# Patient Record
Sex: Female | Born: 1985 | Race: White | Hispanic: No | Marital: Single | Smoking: Current every day smoker
Health system: Southern US, Community
[De-identification: ages and names within clinical notes are randomized; demographics above are authoritative.]

## PROBLEM LIST (undated history)

## (undated) DIAGNOSIS — G8929 Other chronic pain: Secondary | ICD-10-CM

## (undated) DIAGNOSIS — M549 Dorsalgia, unspecified: Secondary | ICD-10-CM

## (undated) HISTORY — PX: TUBAL LIGATION: SHX77

## (undated) HISTORY — PX: NECK SURGERY: SHX720

## (undated) HISTORY — PX: TONSILLECTOMY: SUR1361

## (undated) HISTORY — DX: Dorsalgia, unspecified: M54.9

## (undated) HISTORY — DX: Other chronic pain: G89.29

---
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
## 2003-11-14 ENCOUNTER — Emergency Department (HOSPITAL_COMMUNITY): Admission: EM | Admit: 2003-11-14 | Discharge: 2003-11-14 | Payer: Self-pay | Admitting: Emergency Medicine

## 2004-02-15 ENCOUNTER — Emergency Department (HOSPITAL_COMMUNITY): Admission: EM | Admit: 2004-02-15 | Discharge: 2004-02-15 | Payer: Self-pay | Admitting: *Deleted

## 2004-12-28 ENCOUNTER — Ambulatory Visit (HOSPITAL_COMMUNITY): Admission: AD | Admit: 2004-12-28 | Discharge: 2004-12-28 | Payer: Self-pay | Admitting: Obstetrics and Gynecology

## 2005-07-10 ENCOUNTER — Ambulatory Visit (HOSPITAL_COMMUNITY): Admission: RE | Admit: 2005-07-10 | Discharge: 2005-07-10 | Payer: Self-pay | Admitting: Family Medicine

## 2005-07-30 ENCOUNTER — Encounter (HOSPITAL_COMMUNITY): Admission: RE | Admit: 2005-07-30 | Discharge: 2005-08-29 | Payer: Self-pay | Admitting: Family Medicine

## 2005-11-06 ENCOUNTER — Ambulatory Visit: Payer: Self-pay | Admitting: Internal Medicine

## 2006-01-09 ENCOUNTER — Ambulatory Visit (HOSPITAL_COMMUNITY): Admission: RE | Admit: 2006-01-09 | Discharge: 2006-01-09 | Payer: Self-pay | Admitting: Internal Medicine

## 2007-03-29 ENCOUNTER — Emergency Department (HOSPITAL_COMMUNITY): Admission: EM | Admit: 2007-03-29 | Discharge: 2007-03-29 | Payer: Self-pay | Admitting: Emergency Medicine

## 2007-04-03 ENCOUNTER — Ambulatory Visit (HOSPITAL_COMMUNITY): Admission: RE | Admit: 2007-04-03 | Discharge: 2007-04-03 | Payer: Self-pay | Admitting: Family Medicine

## 2007-11-13 ENCOUNTER — Ambulatory Visit (HOSPITAL_COMMUNITY): Admission: RE | Admit: 2007-11-13 | Discharge: 2007-11-13 | Payer: Self-pay | Admitting: Family Medicine

## 2008-05-23 ENCOUNTER — Ambulatory Visit (HOSPITAL_COMMUNITY): Admission: RE | Admit: 2008-05-23 | Discharge: 2008-05-23 | Payer: Self-pay | Admitting: Pediatrics

## 2009-04-14 ENCOUNTER — Inpatient Hospital Stay (HOSPITAL_COMMUNITY): Admission: AC | Admit: 2009-04-14 | Discharge: 2009-04-22 | Payer: Self-pay

## 2009-05-26 ENCOUNTER — Ambulatory Visit (HOSPITAL_COMMUNITY): Admission: RE | Admit: 2009-05-26 | Discharge: 2009-05-26 | Payer: Self-pay | Admitting: Psychology

## 2009-09-23 DIAGNOSIS — G8929 Other chronic pain: Secondary | ICD-10-CM

## 2009-09-23 HISTORY — DX: Other chronic pain: G89.29

## 2010-03-21 ENCOUNTER — Encounter: Payer: Self-pay | Admitting: Internal Medicine

## 2010-03-21 ENCOUNTER — Ambulatory Visit: Payer: Self-pay | Admitting: Gastroenterology

## 2010-03-21 DIAGNOSIS — K219 Gastro-esophageal reflux disease without esophagitis: Secondary | ICD-10-CM

## 2010-03-22 ENCOUNTER — Encounter: Payer: Self-pay | Admitting: Gastroenterology

## 2010-03-29 ENCOUNTER — Encounter: Payer: Self-pay | Admitting: Gastroenterology

## 2010-04-09 ENCOUNTER — Ambulatory Visit (HOSPITAL_COMMUNITY): Admission: RE | Admit: 2010-04-09 | Discharge: 2010-04-09 | Payer: Self-pay | Admitting: Internal Medicine

## 2010-04-09 ENCOUNTER — Ambulatory Visit: Payer: Self-pay | Admitting: Internal Medicine

## 2010-04-24 ENCOUNTER — Encounter: Payer: Self-pay | Admitting: Urgent Care

## 2010-04-24 ENCOUNTER — Telehealth (INDEPENDENT_AMBULATORY_CARE_PROVIDER_SITE_OTHER): Payer: Self-pay

## 2010-05-21 ENCOUNTER — Encounter (INDEPENDENT_AMBULATORY_CARE_PROVIDER_SITE_OTHER): Payer: Self-pay | Admitting: *Deleted

## 2010-07-06 ENCOUNTER — Ambulatory Visit: Payer: Self-pay | Admitting: Gastroenterology

## 2010-07-06 ENCOUNTER — Encounter: Payer: Self-pay | Admitting: Gastroenterology

## 2010-07-06 DIAGNOSIS — R1013 Epigastric pain: Secondary | ICD-10-CM

## 2010-07-10 ENCOUNTER — Ambulatory Visit (HOSPITAL_COMMUNITY): Admission: RE | Admit: 2010-07-10 | Discharge: 2010-07-10 | Payer: Self-pay | Admitting: Gastroenterology

## 2010-07-12 ENCOUNTER — Encounter: Payer: Self-pay | Admitting: Gastroenterology

## 2010-07-18 ENCOUNTER — Encounter (HOSPITAL_COMMUNITY)
Admission: RE | Admit: 2010-07-18 | Discharge: 2010-08-17 | Payer: Self-pay | Source: Home / Self Care | Admitting: Gastroenterology

## 2010-07-23 ENCOUNTER — Encounter: Payer: Self-pay | Admitting: Internal Medicine

## 2010-08-21 ENCOUNTER — Encounter: Payer: Self-pay | Admitting: Internal Medicine

## 2010-08-30 ENCOUNTER — Emergency Department (HOSPITAL_COMMUNITY)
Admission: EM | Admit: 2010-08-30 | Discharge: 2010-08-30 | Payer: Self-pay | Source: Home / Self Care | Admitting: Emergency Medicine

## 2010-10-13 ENCOUNTER — Encounter: Payer: Self-pay | Admitting: Family Medicine

## 2010-10-13 ENCOUNTER — Encounter: Payer: Self-pay | Admitting: Gastroenterology

## 2010-10-23 NOTE — Medication Information (Signed)
Summary: Tax adviser   Imported By: Rosine Beat 05/21/2010 10:01:34  _____________________________________________________________________  External Attachment:    Type:   Image     Comment:   External Document
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
## 2010-10-23 NOTE — Letter (Signed)
Summary: [facility] REFERRAL  [facility] REFERRAL   Imported By: Ave Filter 07/23/2010 12:33:55  _____________________________________________________________________  External Attachment:    Type:   Image     Comment:   External Document

## 2010-10-23 NOTE — Medication Information (Signed)
Summary: ACIPHEX  ACIPHEX   Imported By: Rexene Alberts 04/24/2010 14:24:06  _____________________________________________________________________  External Attachment:    Type:   Image     Comment:   External Document  Appended Document: ACIPHEX Please call for prior auth, pt failed prilosec, nexium, omeprazole  Appended Document: ACIPHEX paperwork on [facility] cart

## 2010-10-23 NOTE — Letter (Signed)
Summary: [facility]  referral from [facility]   Imported By: Rosine Beat 03/29/2010 15:44:14  _____________________________________________________________________  External Attachment:    Type:   Image     Comment:   External Document

## 2010-10-23 NOTE — Letter (Signed)
Summary: abd u/s order  abd u/s order   Imported By: Ave Filter 07/06/2010 12:18:43  _____________________________________________________________________  External Attachment:    Type:   Image     Comment:   External Document

## 2010-10-23 NOTE — Letter (Signed)
Summary: egd order  egd order   Imported By: Hendricks Limes LPN 93/81/0175 10:25:85  _____________________________________________________________________  External Attachment:    Type:   Image     Comment:   External Document

## 2010-10-23 NOTE — Assessment & Plan Note (Signed)
Summary: fu egd in july, 2011 for reflux,hiatal hernia/jbb   Visit Type:  f/u Primary Care Provider:  Halm  Chief Complaint:  follow up- still has heartburn.  History of Present Illness: Patient is here for f/u. She had EGD in 7/11. She had Z-line at 40cm, small hh.   Aciphex worked for about four weeks. Now she has recurrent burning in the chest all the way to her throat. Some days heartburn all day. Has problems at least every other day. No nocturnal symptoms. Has heartburn without meals. Avoids spicey and greasy foods. Decreased caffeine intake. One cup coffee and one can of [facility] daily. No dysphagia. BMs good. No melena, brbpr. Takes ibuprofen 1000mg  every 3 hours. Has some epigastric pain at times. No sure if pp. H/O biliary dyskinesia by HIDA in 2006.   Current Medications (verified): 1)  Adderall 30 Mg Tabs (Amphetamine-Dextroamphetamine) .... Once Daily 2)  Alprazolam 1 Mg Tabs (Alprazolam) .... Three Times A Day 3)  Singulair 10 Mg Tabs (Montelukast Sodium) .... Once Daily As Needed 4)  Nasal Spray .... Once Daily 5)  Ibuprofen 200 Mg Tabs (Ibuprofen) .... 6 Q Four Hours As Needed 6)  Aciphex 20 Mg Tbec (Rabeprazole Sodium) .... Once Daily  Allergies (verified): 1)  ! Demerol  Family History: Father, deceased age 24s, brain tumor. Grandmother, ?stomach or colon cancer, Crohns  Another second degree relative with pancreatic cancer Mother, stomach ulcers, h/o intestinal blockage, IBS  Review of Systems      See HPI  Vital Signs:  Patient profile:   25 year old female Height:      64 inches Weight:      138 pounds BMI:     23.77 Temp:     98.1 degrees F oral Pulse rate:   88 / minute BP sitting:   110 / 70  (left arm) Cuff size:   regular  Vitals Entered By: Hendricks Limes LPN (July 06, 2010 10:58 AM)  Physical Exam  General:  Well developed, well nourished, no acute distress. Head:  Normocephalic and atraumatic. Eyes:  sclera nonicteric Mouth:  op  moist Abdomen:  Soft. Mild epig tenderness. No rebound or guarding. No HSM or masses. No abd bruit or hernia.  Impression & Recommendations:  Problem # 1:  GERD, SEVERE (ICD-530.81)  Refractory heartburn-like symptoms and epigastric discomfort. She has been on high-dose omeprazole, Nexium. Now on Aciphex which seemed to help short-term. She continues to take way too much ibuprofen....1000mg  every 3 hours. I had long discussion with her about her ibuprofen use and risk of GI and renal complications including renal failure. She is taking ibuprofen for chronic back pain. She has not been to a specialist and I advised she follow up with her PCP for this.   She will take up to two Aciphex daily as needed but try limiting to once daily. #14 day voucher provided to supplement her RX. Given her h/o biliary dyskinesia in 2006 and refractory GERD symptoms, we will r/o atypcial biliary symptoms. Abd U/S in near future. If gb w/u negative, then next step would be pH/impedence study as suggested by Dr. Jena Gauss.   Antireflux measures to be followed, addressed again today.   Orders: Est. Patient Level II (16109)

## 2010-10-23 NOTE — Letter (Signed)
Summary: Internal Other  Internal Other   Imported By: Peggyann Shoals 03/22/2010 14:31:03  _____________________________________________________________________  External Attachment:    Type:   Image     Comment:   External Document

## 2010-10-23 NOTE — Progress Notes (Signed)
Summary: aciphex samples  Phone Note Call from Patient Call back at Home Phone [phone]   Caller: Patient Summary of Call:  pt called-left voicemail- she is out of Aciphex and needs a PA with her insurance. gave #12 samples to last untill PA is finished. called pt left voicemail for her to pick up samples Initial call taken by: Hendricks Limes LPN,  April 24, 2010 3:49 PM

## 2010-10-23 NOTE — Letter (Signed)
Summary: HIDA SCAN ORDER  HIDA SCAN ORDER   Imported By: Ave Filter 07/12/2010 08:46:46  _____________________________________________________________________  External Attachment:    Type:   Image     Comment:   External Document

## 2010-10-23 NOTE — Letter (Signed)
Summary: [facility]  [facility]   Imported By: Rexene Alberts 08/21/2010 11:01:31  _____________________________________________________________________  External Attachment:    Type:   Image     Comment:   External Document  Appended Document: [facility] add carafate suspension to regimen; one gram qid disp 60 doses w 1 yr refill for "acid sensitive" esophagus  Appended Document: [facility] pt aware, rx called to [facility]
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
## 2010-12-27 ENCOUNTER — Emergency Department (HOSPITAL_COMMUNITY)
Admission: EM | Admit: 2010-12-27 | Discharge: 2010-12-27 | Disposition: A | Discharge status: Home / Self Care | Payer: Medicaid Other | Attending: Emergency Medicine | Admitting: Emergency Medicine

## 2010-12-27 DIAGNOSIS — M545 Low back pain, unspecified: Secondary | ICD-10-CM | POA: Insufficient documentation

## 2010-12-27 DIAGNOSIS — R509 Fever, unspecified: Secondary | ICD-10-CM | POA: Insufficient documentation

## 2010-12-27 DIAGNOSIS — J020 Streptococcal pharyngitis: Secondary | ICD-10-CM | POA: Insufficient documentation

## 2010-12-27 DIAGNOSIS — R599 Enlarged lymph nodes, unspecified: Secondary | ICD-10-CM | POA: Insufficient documentation

## 2010-12-27 DIAGNOSIS — IMO0001 Reserved for inherently not codable concepts without codable children: Secondary | ICD-10-CM | POA: Insufficient documentation

## 2010-12-27 DIAGNOSIS — M542 Cervicalgia: Secondary | ICD-10-CM | POA: Insufficient documentation

## 2010-12-27 DIAGNOSIS — R070 Pain in throat: Secondary | ICD-10-CM | POA: Insufficient documentation

## 2010-12-27 DIAGNOSIS — H9209 Otalgia, unspecified ear: Secondary | ICD-10-CM | POA: Insufficient documentation

## 2010-12-27 LAB — URINALYSIS, ROUTINE W REFLEX MICROSCOPIC
Bilirubin Urine: NEGATIVE
Glucose, UA: NEGATIVE mg/dL
Hgb urine dipstick: NEGATIVE
Ketones, ur: 15 mg/dL — AB
Nitrite: NEGATIVE
Protein, ur: NEGATIVE mg/dL
Specific Gravity, Urine: 1.026 (ref 1.005–1.030)
Urobilinogen, UA: 0.2 mg/dL (ref 0.0–1.0)
pH: 5.5 (ref 5.0–8.0)

## 2010-12-27 LAB — CBC
HCT: 34.5 % — ABNORMAL LOW (ref 36.0–46.0)
Hemoglobin: 11.9 g/dL — ABNORMAL LOW (ref 12.0–15.0)
MCH: 31 pg (ref 26.0–34.0)
MCHC: 34.5 g/dL (ref 30.0–36.0)
MCV: 89.8 fL (ref 78.0–100.0)
Platelets: 242 10*3/uL (ref 150–400)
RBC: 3.84 MIL/uL — ABNORMAL LOW (ref 3.87–5.11)
RDW: 12.4 % (ref 11.5–15.5)
WBC: 15.8 10*3/uL — ABNORMAL HIGH (ref 4.0–10.5)

## 2010-12-27 LAB — DIFFERENTIAL
Basophils Absolute: 0 10*3/uL (ref 0.0–0.1)
Basophils Relative: 0 % (ref 0–1)
Eosinophils Absolute: 0 10*3/uL (ref 0.0–0.7)
Eosinophils Relative: 0 % (ref 0–5)
Lymphocytes Relative: 10 % — ABNORMAL LOW (ref 12–46)
Lymphs Abs: 1.6 K/uL (ref 0.7–4.0)
Monocytes Absolute: 1 K/uL (ref 0.1–1.0)
Monocytes Relative: 7 % (ref 3–12)
Neutro Abs: 13.1 K/uL — ABNORMAL HIGH (ref 1.7–7.7)
Neutrophils Relative %: 83 % — ABNORMAL HIGH (ref 43–77)

## 2010-12-27 LAB — POCT I-STAT, CHEM 8
BUN: 12 mg/dL (ref 6–23)
Calcium, Ion: 1.08 mmol/L — ABNORMAL LOW (ref 1.12–1.32)
Chloride: 102 mEq/L (ref 96–112)
Creatinine, Ser: 0.7 mg/dL (ref 0.4–1.2)
Glucose, Bld: 104 mg/dL — ABNORMAL HIGH (ref 70–99)
HCT: 37 % (ref 36.0–46.0)
Hemoglobin: 12.6 g/dL (ref 12.0–15.0)
Potassium: 3.3 meq/L — ABNORMAL LOW (ref 3.5–5.1)
Sodium: 137 meq/L (ref 135–145)
TCO2: 24 mmol/L (ref 0–100)

## 2010-12-27 LAB — RAPID STREP SCREEN (MED CTR MEBANE ONLY): Streptococcus, Group A Screen (Direct): POSITIVE — AB

## 2010-12-30 LAB — CBC
HCT: 20 % — ABNORMAL LOW (ref 36.0–46.0)
HCT: 20.3 % — ABNORMAL LOW (ref 36.0–46.0)
HCT: 20.6 % — ABNORMAL LOW (ref 36.0–46.0)
HCT: 21.8 % — ABNORMAL LOW (ref 36.0–46.0)
HCT: 24 % — ABNORMAL LOW (ref 36.0–46.0)
HCT: 24.7 % — ABNORMAL LOW (ref 36.0–46.0)
HCT: 32.3 % — ABNORMAL LOW (ref 36.0–46.0)
Hemoglobin: 10.9 g/dL — ABNORMAL LOW (ref 12.0–15.0)
Hemoglobin: 6.8 g/dL — CL (ref 12.0–15.0)
Hemoglobin: 6.8 g/dL — CL (ref 12.0–15.0)
Hemoglobin: 6.9 g/dL — CL (ref 12.0–15.0)
Hemoglobin: 6.9 g/dL — CL (ref 12.0–15.0)
Hemoglobin: 7 g/dL — CL (ref 12.0–15.0)
Hemoglobin: 7.2 g/dL — CL (ref 12.0–15.0)
Hemoglobin: 8.2 g/dL — ABNORMAL LOW (ref 12.0–15.0)
Hemoglobin: 8.5 g/dL — ABNORMAL LOW (ref 12.0–15.0)
MCHC: 33.7 g/dL (ref 30.0–36.0)
MCHC: 33.8 g/dL (ref 30.0–36.0)
MCHC: 34 g/dL (ref 30.0–36.0)
MCHC: 34 g/dL (ref 30.0–36.0)
MCHC: 34.1 g/dL (ref 30.0–36.0)
MCHC: 34.1 g/dL (ref 30.0–36.0)
MCHC: 34.1 g/dL (ref 30.0–36.0)
MCHC: 34.3 g/dL (ref 30.0–36.0)
MCHC: 34.5 g/dL (ref 30.0–36.0)
MCHC: 35.1 g/dL (ref 30.0–36.0)
MCV: 92.8 fL (ref 78.0–100.0)
MCV: 93.2 fL (ref 78.0–100.0)
MCV: 93.2 fL (ref 78.0–100.0)
MCV: 93.4 fL (ref 78.0–100.0)
MCV: 93.7 fL (ref 78.0–100.0)
MCV: 94.1 fL (ref 78.0–100.0)
MCV: 94.1 fL (ref 78.0–100.0)
MCV: 94.2 fL (ref 78.0–100.0)
MCV: 94.4 fL (ref 78.0–100.0)
MCV: 94.8 fL (ref 78.0–100.0)
Platelets: 150 10*3/uL (ref 150–400)
Platelets: 156 10*3/uL (ref 150–400)
Platelets: 161 10*3/uL (ref 150–400)
Platelets: 171 10*3/uL (ref 150–400)
Platelets: 189 10*3/uL (ref 150–400)
Platelets: 296 10*3/uL (ref 150–400)
RBC: 2.11 MIL/uL — ABNORMAL LOW (ref 3.87–5.11)
RBC: 2.14 MIL/uL — ABNORMAL LOW (ref 3.87–5.11)
RBC: 2.19 MIL/uL — ABNORMAL LOW (ref 3.87–5.11)
RBC: 2.21 MIL/uL — ABNORMAL LOW (ref 3.87–5.11)
RBC: 2.27 MIL/uL — ABNORMAL LOW (ref 3.87–5.11)
RBC: 2.56 MIL/uL — ABNORMAL LOW (ref 3.87–5.11)
RBC: 2.62 MIL/uL — ABNORMAL LOW (ref 3.87–5.11)
RBC: 3.09 MIL/uL — ABNORMAL LOW (ref 3.87–5.11)
RBC: 3.43 MIL/uL — ABNORMAL LOW (ref 3.87–5.11)
RDW: 12 % (ref 11.5–15.5)
RDW: 12.2 % (ref 11.5–15.5)
RDW: 12.5 % (ref 11.5–15.5)
RDW: 12.6 % (ref 11.5–15.5)
WBC: 10 10*3/uL (ref 4.0–10.5)
WBC: 16.1 10*3/uL — ABNORMAL HIGH (ref 4.0–10.5)
WBC: 5.2 10*3/uL (ref 4.0–10.5)
WBC: 5.3 10*3/uL (ref 4.0–10.5)
WBC: 6.4 10*3/uL (ref 4.0–10.5)
WBC: 6.4 10*3/uL (ref 4.0–10.5)

## 2010-12-30 LAB — POCT I-STAT, CHEM 8
Chloride: 108 meq/L (ref 96–112)
Creatinine, Ser: 0.7 mg/dL (ref 0.4–1.2)
Glucose, Bld: 146 mg/dL — ABNORMAL HIGH (ref 70–99)
HCT: 33 % — ABNORMAL LOW (ref 36.0–46.0)
Potassium: 3.2 meq/L — ABNORMAL LOW (ref 3.5–5.1)

## 2010-12-30 LAB — URINALYSIS, ROUTINE W REFLEX MICROSCOPIC
Ketones, ur: NEGATIVE mg/dL
Protein, ur: >300 mg/dL — AB
Urobilinogen, UA: 1 mg/dL (ref 0.0–1.0)

## 2010-12-30 LAB — BASIC METABOLIC PANEL
BUN: 6 mg/dL (ref 6–23)
BUN: 9 mg/dL (ref 6–23)
CO2: 28 mEq/L (ref 19–32)
Calcium: 8.2 mg/dL — ABNORMAL LOW (ref 8.4–10.5)
Chloride: 103 mEq/L (ref 96–112)
Chloride: 107 mEq/L (ref 96–112)
Creatinine, Ser: 0.58 mg/dL (ref 0.4–1.2)
GFR calc Af Amer: >60 mL/min (ref >60)
Potassium: 3.7 mEq/L (ref 3.5–5.1)

## 2010-12-30 LAB — TYPE AND SCREEN: ABO/RH(D): A POS

## 2010-12-30 LAB — PROTIME-INR
INR: 1.1 (ref 0.00–1.49)
Prothrombin Time: 15.5 s — ABNORMAL HIGH (ref 11.6–15.2)

## 2010-12-30 LAB — URINE MICROSCOPIC-ADD ON

## 2010-12-30 LAB — PREPARE RBC (CROSSMATCH)

## 2010-12-30 LAB — MRSA PCR SCREENING: MRSA by PCR: NEGATIVE
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
## 2011-02-08 NOTE — Discharge Summary (Signed)
NAMEEVANGELYNE, LOJA NO.:  [id number]   MEDICAL RECORD NO.:  [id number]          PATIENT TYPE:  OIB   LOCATION:  LDR2                          FACILITY:  [facility]   PHYSICIAN:  Tilda Burrow, M.D. DATE OF BIRTH:  11-20-1985   DATE OF ADMISSION:  12/28/2004  DATE OF DISCHARGE:  04/07/2006LH                                 DISCHARGE SUMMARY   CHIEF COMPLAINT:  Absence of fetal movement at 23 weeks' gestation.   HISTORY OF PRESENT ILLNESS:  This is a 25 year old female, G1, P0 at 12  weeks' gestation followed by Dr. Ruthy Dick in [place], [place], who  presents to labor and delivery here complaining of absence of fetal  movement.  She became concerned after discussing with a friend who felt  absence of confirmed fetal movement at 23 weeks was distinctly abnormal.  The patient does not know the location of placenta on ultrasound.  She has  had no bleeding or spotting.  She does ask about a light vaginal discharge.   PHYSICAL EXAMINATION:  GENERAL:  Healthy-appearing, red-headed female alert  and oriented x3.  ABDOMEN:  Nontender abdomen.  Gravid uterus to above the umbilicus.  Fetal  heart rate documentation shows an active fetus with fetal heart rate in the  140s with no decelerations present.  Appropriate for gestational age.  PELVIC:  Cervix is posterior, long, closed and firm.  External monitoring  shows that fetal kicks are occurring and indeed the patient has heard the  fetal movement on ultrasound.   IMPRESSION:  Normal pregnancy with fetal movement now noted by patient.   PLAN:  Routine OB care.      JVF/MEDQ  D:  12/28/2004  T:  12/29/2004  Job:  [id number]   cc:   Ruthy Dick  [address]  [place]  [place] [address]  Fax: [phone]   [facility]

## 2011-07-09 LAB — URINE CULTURE
Colony Count: NO GROWTH
Culture: NO GROWTH

## 2011-07-09 LAB — URINALYSIS, ROUTINE W REFLEX MICROSCOPIC
Glucose, UA: NEGATIVE
Hgb urine dipstick: NEGATIVE
Specific Gravity, Urine: 1.01
pH: 7

## 2011-07-09 LAB — COMPREHENSIVE METABOLIC PANEL
AST: 22
Albumin: 4.4
Alkaline Phosphatase: 48
BUN: 9
Chloride: 103
Creatinine, Ser: 0.87
GFR calc Af Amer: >60
Potassium: 3.9
Total Protein: 7.3

## 2011-07-09 LAB — CBC
HCT: 42.5
Platelets: 330
RDW: 12.7
WBC: 10.4

## 2011-07-09 LAB — URINE MICROSCOPIC-ADD ON

## 2011-07-09 LAB — DIFFERENTIAL
Eosinophils Relative: 1
Lymphocytes Relative: 36
Monocytes Absolute: 0.6
Monocytes Relative: 6
Neutro Abs: 5.8

## 2012-12-08 ENCOUNTER — Telehealth: Payer: Self-pay | Admitting: Nurse Practitioner

## 2012-12-08 DIAGNOSIS — F909 Attention-deficit hyperactivity disorder, unspecified type: Secondary | ICD-10-CM

## 2012-12-08 NOTE — Telephone Encounter (Signed)
Needs refill on adderall. [facility]

## 2012-12-09 ENCOUNTER — Telehealth: Payer: Self-pay | Admitting: Family Medicine

## 2012-12-09 ENCOUNTER — Telehealth: Payer: Self-pay | Admitting: Nurse Practitioner

## 2012-12-09 DIAGNOSIS — F909 Attention-deficit hyperactivity disorder, unspecified type: Secondary | ICD-10-CM

## 2012-12-09 MED ORDER — AMPHETAMINE-DEXTROAMPHETAMINE 20 MG PO TABS: 20.0000 mg | ORAL_TABLET | Freq: Two times a day (BID) | ORAL | Status: DC

## 2012-12-09 NOTE — Telephone Encounter (Signed)
Rx written incorrectly for only 30 and patient takes BID and needed 60

## 2012-12-09 NOTE — Telephone Encounter (Signed)
rx was written incorrectly. It only had for 30 pill. Needs to be 60 pills. adderrall  [facility] [place]

## 2012-12-09 NOTE — Telephone Encounter (Signed)
Rx was completed and left at the front desk per Paulene Floor.

## 2012-12-09 NOTE — Telephone Encounter (Signed)
Chart on desk

## 2012-12-15 ENCOUNTER — Other Ambulatory Visit: Payer: Self-pay | Admitting: *Deleted

## 2012-12-15 MED ORDER — TRAMADOL HCL 50 MG PO TABS: 50.0000 mg | ORAL_TABLET | Freq: Three times a day (TID) | ORAL | Status: DC | PRN

## 2012-12-23 ENCOUNTER — Telehealth: Payer: Self-pay | Admitting: Nurse Practitioner

## 2012-12-23 NOTE — Telephone Encounter (Signed)
Called pt and told she must be seen

## 2012-12-30 ENCOUNTER — Encounter: Payer: Self-pay | Admitting: Nurse Practitioner

## 2012-12-30 ENCOUNTER — Ambulatory Visit (INDEPENDENT_AMBULATORY_CARE_PROVIDER_SITE_OTHER): Payer: Medicaid Other | Admitting: Nurse Practitioner

## 2012-12-30 VITALS — BP 122/67 | HR 83 | Temp 98.7°F | Ht 64.0 in | Wt 149.0 lb

## 2012-12-30 DIAGNOSIS — G8929 Other chronic pain: Secondary | ICD-10-CM

## 2012-12-30 DIAGNOSIS — M549 Dorsalgia, unspecified: Secondary | ICD-10-CM

## 2012-12-30 DIAGNOSIS — Z1339 Encounter for screening examination for other mental health and behavioral disorders: Secondary | ICD-10-CM

## 2012-12-30 DIAGNOSIS — F411 Generalized anxiety disorder: Secondary | ICD-10-CM

## 2012-12-30 DIAGNOSIS — K219 Gastro-esophageal reflux disease without esophagitis: Secondary | ICD-10-CM

## 2012-12-30 DIAGNOSIS — K449 Diaphragmatic hernia without obstruction or gangrene: Secondary | ICD-10-CM | POA: Insufficient documentation

## 2012-12-30 DIAGNOSIS — F988 Other specified behavioral and emotional disorders with onset usually occurring in childhood and adolescence: Secondary | ICD-10-CM

## 2012-12-30 MED ORDER — TRAMADOL HCL 50 MG PO TABS: 50.0000 mg | ORAL_TABLET | Freq: Three times a day (TID) | ORAL | Status: DC | PRN

## 2012-12-30 MED ORDER — ALPRAZOLAM 1 MG PO TABS: 1.0000 mg | ORAL_TABLET | Freq: Every evening | ORAL | Status: DC | PRN

## 2012-12-30 MED ORDER — AMPHETAMINE-DEXTROAMPHET ER 30 MG PO CP24: 30.0000 mg | ORAL_CAPSULE | ORAL | Status: DC

## 2012-12-30 NOTE — Progress Notes (Signed)
Subjective:    Patient ID: Sonia Bailey, female    DOB: [birth year], 27 y.o.   MRN: [id number]  Gastrophageal Reflux She complains of heartburn. She reports no belching, no chest pain, no coughing or no nausea. This is a chronic problem. The current episode started more than 1 year ago. The problem occurs frequently. The problem has been unchanged. The heartburn duration is an hour. The heartburn is located in the substernum. The heartburn is of moderate intensity. The heartburn wakes her from sleep. The heartburn does not limit her activity. The symptoms are aggravated by smoking and caffeine. Pertinent negatives include no fatigue, muscle weakness or weight loss. Risk factors include hiatal hernia and smoking/tobacco exposure. She has tried a PPI for the symptoms. The treatment provided mild relief. Past procedures include an EGD.  Back Pain This is a chronic problem. The current episode started more than 1 year ago. The problem occurs daily. The problem is unchanged. The pain is present in the lumbar spine. The quality of the pain is described as stabbing and aching. The pain does not radiate. The pain is at a severity of 10/10. The pain is moderate. The symptoms are aggravated by sitting. Stiffness is present in the morning. Pertinent negatives include no bladder incontinence, chest pain, headaches, weakness or weight loss. She has tried heat, NSAIDs, walking, bed rest and ice for the symptoms. The treatment provided mild relief.  GAD Takes xanax for stress and anxiety. Takes at lease BID. Mom has cancer and she is having to take mom for treatments. ADHD This is a chronic problem. Patient has been taking adderall for over 8 years. Patient thinks she needs to up her dose.D oesn't seem to be helping as much. Unable to complete tasks like she use to.    Review of Systems  Constitutional: Negative.  Negative for weight loss and fatigue.  Respiratory: Negative.  Negative for cough.    Cardiovascular: Negative.  Negative for chest pain.  Gastrointestinal: Positive for heartburn. Negative for nausea.  Genitourinary: Negative for bladder incontinence.  Musculoskeletal: Positive for back pain. Negative for muscle weakness.  Neurological: Negative for weakness and headaches.  All other systems reviewed and are negative.  BP 122/67  Pulse 83  Temp(Src) 98.7 F (37.1 C) (Oral)  Ht 5\' 4"  (1.626 m)  Wt 149 lb (67.586 kg)  BMI 25.56 kg/m2  LMP 12/23/2012      Objective:   Physical Exam  Constitutional: She is oriented to person, place, and time. She appears well-developed and well-nourished.  HENT:  Nose: Nose normal.  Mouth/Throat: Oropharynx is clear and moist.  Eyes: EOM are normal.  Neck: Trachea normal, normal range of motion and full passive range of motion without pain. Neck supple. No JVD present. Carotid bruit is not present. No thyromegaly present.  Cardiovascular: Normal rate, regular rhythm, normal heart sounds and intact distal pulses.  Exam reveals no gallop and no friction rub.   No murmur heard. Pulmonary/Chest: Effort normal and breath sounds normal.  Abdominal: Soft. Bowel sounds are normal. She exhibits no distension and no mass. There is no tenderness.  Musculoskeletal: Normal range of motion.  Lymphadenopathy:    She has no cervical adenopathy.  Neurological: She is alert and oriented to person, place, and time. She has normal reflexes.  Skin: Skin is warm and dry.  Psychiatric: She has a normal mood and affect. Her behavior is normal. Judgment and thought content normal.          Assessment &  Plan:  1. Back pain, chronic -Moist heat - traMADol (ULTRAM) 50 MG tablet; Take 1 tablet (50 mg total) by mouth every 8 (eight) hours as needed for pain.  Dispense: 90 tablet; Refill: 0 - Ambulatory referral to Orthopedic Surgery  2. Hiatal hernia -Omeprazole 20 mg daily -Avoid Spicy and diary products  3. GERD (gastroesophageal reflux  disease) -Omeprazole 20 mg every day -Avoid Spicy foods  4. GAD (generalized anxiety disorder) -Stress managementt - ALPRAZolam (XANAX) 1 MG tablet; Take 1 tablet (1 mg total) by mouth at bedtime as needed for sleep.  Dispense: 60 tablet; Refill: 0  5. ADHD (attention deficit hyperactivity disorder) evaluation -Adderall increased - amphetamine-dextroamphetamine (ADDERALL XR) 30 MG 24 hr capsule; Take 1 capsule (30 mg total) by mouth every morning.  Dispense: 30 capsule; Refill: 0  Mary-Margaret Daphine Deutscher, FNP

## 2012-12-30 NOTE — Patient Instructions (Signed)
Attention Deficit Hyperactivity Disorder Attention deficit hyperactivity disorder (ADHD) is a problem with behavior issues based on the way the brain functions (neurobehavioral disorder). It is a common reason for behavior and academic problems in school. CAUSES  The cause of ADHD is unknown in most cases. It may run in families. It sometimes can be associated with learning disabilities and other behavioral problems. SYMPTOMS  There are 3 types of ADHD. The 3 types and some of the symptoms include:  Inattentive  Gets bored or distracted easily.  Loses or forgets things. Forgets to hand in homework.  Has trouble organizing or completing tasks.  Difficulty staying on task.  An inability to organize daily tasks and school work.  Leaving projects, chores, or homework unfinished.  Trouble paying attention or responding to details. Careless mistakes.  Difficulty following directions. Often seems like is not listening.  Dislikes activities that require sustained attention (like chores or homework).  Hyperactive-impulsive  Feels like it is impossible to sit still or stay in a seat. Fidgeting with hands and feet.  Trouble waiting turn.  Talking too much or out of turn. Interruptive.  Speaks or acts impulsively.  Aggressive, disruptive behavior.  Constantly busy or on the go, noisy.  Combined  Has symptoms of both of the above. Often children with ADHD feel discouraged about themselves and with school. They often perform well below their abilities in school. These symptoms can cause problems in home, school, and in relationships with peers. As children get older, the excess motor activities can calm down, but the problems with paying attention and staying organized persist. Most children do not outgrow ADHD but with good treatment can learn to cope with the symptoms. DIAGNOSIS  When ADHD is suspected, the diagnosis should be made by professionals trained in ADHD.  Diagnosis will  include:  Ruling out other reasons for the child's behavior.  The caregivers will check with the child's school and check their medical records.  They will talk to teachers and parents.  Behavior rating scales for the child will be filled out by those dealing with the child on a daily basis. A diagnosis is made only after all information has been considered. TREATMENT  Treatment usually includes behavioral treatment often along with medicines. It may include stimulant medicines. The stimulant medicines decrease impulsivity and hyperactivity and increase attention. Other medicines used include antidepressants and certain blood pressure medicines. Most experts agree that treatment for ADHD should address all aspects of the child's functioning. Treatment should not be limited to the use of medicines alone. Treatment should include structured classroom management. The parents must receive education to address rewarding good behavior, discipline, and limit-setting. Tutoring or behavioral therapy or both should be available for the child. If untreated, the disorder can have long-term serious effects into adolescence and adulthood. HOME CARE INSTRUCTIONS   Often with ADHD there is a lot of frustration among the family in dealing with the illness. There is often blame and anger that is not warranted. This is a life long illness. There is no way to prevent ADHD. In many cases, because the problem affects the family as a whole, the entire family may need help. A therapist can help the family find better ways to handle the disruptive behaviors and promote change. If the child is young, most of the therapist's work is with the parents. Parents will learn techniques for coping with and improving their child's behavior. Sometimes only the child with the ADHD needs counseling. Your caregivers can help   you make these decisions.  Children with ADHD may need help in organizing. Some helpful tips include:  Keep  routines the same every day from wake-up time to bedtime. Schedule everything. This includes homework and playtime. This should include outdoor and indoor recreation. Keep the schedule on the refrigerator or a bulletin board where it is frequently seen. Mark schedule changes as far in advance as possible.  Have a place for everything and keep everything in its place. This includes clothing, backpacks, and school supplies.  Encourage writing down assignments and bringing home needed books.  Offer your child a well-balanced diet. Breakfast is especially important for school performance. Children should avoid drinks with caffeine including:  Soft drinks.  Coffee.  Tea.  However, some older children (adolescents) may find these drinks helpful in improving their attention.  Children with ADHD need consistent rules that they can understand and follow. If rules are followed, give small rewards. Children with ADHD often receive, and expect, criticism. Look for good behavior and praise it. Set realistic goals. Give clear instructions. Look for activities that can foster success and self-esteem. Make time for pleasant activities with your child. Give lots of affection.  Parents are their children's greatest advocates. Learn as much as possible about ADHD. This helps you become a stronger and better advocate for your child. It also helps you educate your child's teachers and instructors if they feel inadequate in these areas. Parent support groups are often helpful. A national group with local chapters is called CHADD (Children and Adults with Attention Deficit Hyperactivity Disorder). PROGNOSIS  There is no cure for ADHD. Children with the disorder seldom outgrow it. Many find adaptive ways to accommodate the ADHD as they mature. SEEK MEDICAL CARE IF:  Your child has repeated muscle twitches, cough or speech outbursts.  Your child has sleep problems.  Your child has a marked loss of  appetite.  Your child develops depression.  Your child has new or worsening behavioral problems.  Your child develops dizziness.  Your child has a racing heart.  Your child has stomach pains.  Your child develops headaches. Document Released: 08/30/2002 Document Revised: 12/02/2011 Document Reviewed: 04/11/2008 ExitCare Patient Information 2013 [employer].  

## 2013-01-01 ENCOUNTER — Other Ambulatory Visit: Payer: Self-pay | Admitting: Nurse Practitioner

## 2013-01-01 DIAGNOSIS — F411 Generalized anxiety disorder: Secondary | ICD-10-CM

## 2013-01-01 MED ORDER — ALPRAZOLAM 1 MG PO TABS: 1.0000 mg | ORAL_TABLET | Freq: Two times a day (BID) | ORAL | Status: DC

## 2013-01-01 MED ORDER — AMPHETAMINE-DEXTROAMPHET ER 30 MG PO CP24: ORAL_CAPSULE | ORAL | Status: DC

## 2013-01-13 ENCOUNTER — Other Ambulatory Visit (HOSPITAL_COMMUNITY): Payer: Self-pay | Admitting: Physical Medicine and Rehabilitation

## 2013-01-13 DIAGNOSIS — M545 Low back pain: Secondary | ICD-10-CM

## 2013-01-15 ENCOUNTER — Ambulatory Visit (HOSPITAL_COMMUNITY)
Admission: RE | Admit: 2013-01-15 | Discharge: 2013-01-15 | Disposition: A | Discharge status: Home / Self Care | Payer: Medicaid Other | Source: Ambulatory Visit | Attending: Physical Medicine and Rehabilitation | Admitting: Physical Medicine and Rehabilitation

## 2013-01-15 DIAGNOSIS — M545 Low back pain, unspecified: Secondary | ICD-10-CM | POA: Insufficient documentation

## 2013-01-28 ENCOUNTER — Other Ambulatory Visit: Payer: Self-pay | Admitting: Nurse Practitioner

## 2013-01-28 DIAGNOSIS — F411 Generalized anxiety disorder: Secondary | ICD-10-CM

## 2013-01-28 DIAGNOSIS — G8929 Other chronic pain: Secondary | ICD-10-CM

## 2013-01-29 ENCOUNTER — Other Ambulatory Visit: Payer: Self-pay | Admitting: Nurse Practitioner

## 2013-01-29 NOTE — Telephone Encounter (Signed)
Last seen and written on 12-30-12

## 2013-02-01 ENCOUNTER — Other Ambulatory Visit: Payer: Self-pay | Admitting: Nurse Practitioner

## 2013-02-01 MED ORDER — TRAMADOL HCL 50 MG PO TABS: 50.0000 mg | ORAL_TABLET | Freq: Three times a day (TID) | ORAL | Status: DC | PRN

## 2013-02-01 MED ORDER — ALPRAZOLAM 1 MG PO TABS: 1.0000 mg | ORAL_TABLET | Freq: Two times a day (BID) | ORAL | Status: DC

## 2013-02-01 MED ORDER — AMPHETAMINE-DEXTROAMPHET ER 30 MG PO CP24: ORAL_CAPSULE | ORAL | Status: DC

## 2013-02-01 NOTE — Telephone Encounter (Signed)
Rx ready for pick up. 

## 2013-02-01 NOTE — Telephone Encounter (Signed)
Pt aware to pick up rx's 

## 2013-02-15 ENCOUNTER — Other Ambulatory Visit: Payer: Self-pay | Admitting: Nurse Practitioner

## 2013-02-16 ENCOUNTER — Other Ambulatory Visit: Payer: Self-pay | Admitting: *Deleted

## 2013-02-16 MED ORDER — OMEPRAZOLE 20 MG PO CPDR: 20.0000 mg | DELAYED_RELEASE_CAPSULE | Freq: Every day | ORAL | Status: DC

## 2013-02-22 ENCOUNTER — Other Ambulatory Visit: Payer: Self-pay | Admitting: Nurse Practitioner

## 2013-02-23 NOTE — Telephone Encounter (Signed)
Last seen 12/30/12, last filled 01/29/13. Will print have nurse call pt. To pickup

## 2013-02-24 ENCOUNTER — Other Ambulatory Visit: Payer: Self-pay | Admitting: Nurse Practitioner

## 2013-02-25 ENCOUNTER — Telehealth: Payer: Self-pay | Admitting: *Deleted

## 2013-02-25 NOTE — Telephone Encounter (Signed)
Aware ultram script ready.

## 2013-03-01 ENCOUNTER — Other Ambulatory Visit: Payer: Self-pay | Admitting: Nurse Practitioner

## 2013-03-01 ENCOUNTER — Telehealth: Payer: Self-pay | Admitting: Nurse Practitioner

## 2013-03-01 MED ORDER — AMPHETAMINE-DEXTROAMPHET ER 30 MG PO CP24: ORAL_CAPSULE | ORAL | Status: DC

## 2013-03-02 NOTE — Telephone Encounter (Signed)
Done 03/01/13

## 2013-03-03 NOTE — Telephone Encounter (Signed)
Last filled 01/29/13, last seen 01/09/13. [facility]

## 2013-03-03 NOTE — Telephone Encounter (Signed)
RX CALLED TO [facility].

## 2013-03-03 NOTE — Telephone Encounter (Signed)
Please call in RX for xanax with 1 refill

## 2013-03-29 ENCOUNTER — Other Ambulatory Visit: Payer: Self-pay | Admitting: Nurse Practitioner

## 2013-03-30 MED ORDER — AMPHETAMINE-DEXTROAMPHET ER 30 MG PO CP24: ORAL_CAPSULE | ORAL | Status: DC

## 2013-03-30 NOTE — Telephone Encounter (Signed)
rx ready for pickup 

## 2013-03-30 NOTE — Telephone Encounter (Signed)
Last seen 12/30/12, last filled 03/01/13, call pt to pick up [phone]

## 2013-03-31 NOTE — Telephone Encounter (Signed)
Patient notified that rx up front ready to pick up 

## 2013-04-05 ENCOUNTER — Other Ambulatory Visit: Payer: Self-pay | Admitting: Nurse Practitioner

## 2013-04-07 NOTE — Telephone Encounter (Signed)
Last seen 12/30/12, last filled 02/28/13,

## 2013-04-26 ENCOUNTER — Other Ambulatory Visit: Payer: Self-pay | Admitting: Nurse Practitioner

## 2013-04-27 MED ORDER — AMPHETAMINE-DEXTROAMPHET ER 30 MG PO CP24: ORAL_CAPSULE | ORAL | Status: DC

## 2013-04-27 NOTE — Telephone Encounter (Signed)
Patient aware rx up front to pick up 

## 2013-04-27 NOTE — Telephone Encounter (Signed)
rx ready for pickup 

## 2013-04-27 NOTE — Telephone Encounter (Signed)
Last filled 03/30/13, is this really for BID?

## 2013-04-29 ENCOUNTER — Other Ambulatory Visit: Payer: Self-pay | Admitting: Nurse Practitioner

## 2013-04-30 NOTE — Telephone Encounter (Signed)
Last seen 12/30/12, last filled 03/01/13. [facility]

## 2013-04-30 NOTE — Telephone Encounter (Signed)
Please call in rx for xanax with 1 refill 

## 2013-05-01 NOTE — Telephone Encounter (Signed)
[facility]ed [facility] [place] 

## 2013-05-10 ENCOUNTER — Other Ambulatory Visit: Payer: Self-pay | Admitting: Nurse Practitioner

## 2013-05-12 NOTE — Telephone Encounter (Signed)
rx ready for pickup 

## 2013-05-12 NOTE — Telephone Encounter (Signed)
LAST RF 04/17/13. 6 DAYS EARLY. CAN PUT ON RX NOT FILL UNTIL 05/18/13. LAST OV 12/30/12. PRINT AND CALL PT IF APPROVED.

## 2013-05-13 NOTE — Telephone Encounter (Signed)
[facility] [place]  

## 2013-05-27 ENCOUNTER — Telehealth: Payer: Self-pay | Admitting: Nurse Practitioner

## 2013-05-31 ENCOUNTER — Other Ambulatory Visit: Payer: Self-pay | Admitting: *Deleted

## 2013-05-31 MED ORDER — AMPHETAMINE-DEXTROAMPHET ER 30 MG PO CP24: ORAL_CAPSULE | ORAL | Status: DC

## 2013-05-31 NOTE — Telephone Encounter (Signed)
Up front 

## 2013-05-31 NOTE — Telephone Encounter (Signed)
rx ready for pickup 

## 2013-05-31 NOTE — Telephone Encounter (Signed)
REFILL REQUEST SENT TO [facility] 

## 2013-05-31 NOTE — Telephone Encounter (Signed)
LAST OV 4/14. PLEASE PRINT AND CALL PT WHEN READY.

## 2013-06-03 ENCOUNTER — Other Ambulatory Visit: Payer: Self-pay | Admitting: Nurse Practitioner

## 2013-06-04 NOTE — Telephone Encounter (Signed)
ntbs for ultram refill

## 2013-06-04 NOTE — Telephone Encounter (Signed)
Last seen 12/30/12  [facility]   If approved print and route to nurse

## 2013-06-10 ENCOUNTER — Encounter: Payer: Self-pay | Admitting: Family Medicine

## 2013-06-10 ENCOUNTER — Ambulatory Visit (INDEPENDENT_AMBULATORY_CARE_PROVIDER_SITE_OTHER): Payer: Medicaid Other | Admitting: Family Medicine

## 2013-06-10 VITALS — BP 118/74 | HR 105 | Temp 98.3°F | Wt 147.4 lb

## 2013-06-10 DIAGNOSIS — M549 Dorsalgia, unspecified: Secondary | ICD-10-CM

## 2013-06-10 MED ORDER — TRAMADOL HCL 50 MG PO TABS: ORAL_TABLET | ORAL | Status: DC

## 2013-06-10 MED ORDER — BACLOFEN 10 MG PO TABS: 10.0000 mg | ORAL_TABLET | Freq: Three times a day (TID) | ORAL | Status: DC
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
## 2013-06-10 NOTE — Patient Instructions (Signed)

## 2013-06-18 ENCOUNTER — Ambulatory Visit: Payer: Self-pay | Admitting: Nurse Practitioner

## 2013-06-21 ENCOUNTER — Other Ambulatory Visit: Payer: Self-pay | Admitting: Family Medicine

## 2013-06-22 NOTE — Telephone Encounter (Signed)
Please review this, was it done? And should she have enough? And what about the fill date?

## 2013-06-25 ENCOUNTER — Telehealth: Payer: Self-pay | Admitting: Nurse Practitioner

## 2013-06-25 MED ORDER — AMPHETAMINE-DEXTROAMPHET ER 30 MG PO CP24: ORAL_CAPSULE | ORAL | Status: DC

## 2013-06-25 NOTE — Telephone Encounter (Signed)
Pt.notified

## 2013-06-25 NOTE — Telephone Encounter (Signed)
rx ready for pickup 

## 2013-07-07 ENCOUNTER — Ambulatory Visit (INDEPENDENT_AMBULATORY_CARE_PROVIDER_SITE_OTHER): Payer: Medicaid Other | Admitting: Family Medicine

## 2013-07-07 ENCOUNTER — Encounter: Payer: Self-pay | Admitting: Family Medicine

## 2013-07-07 ENCOUNTER — Other Ambulatory Visit: Payer: Self-pay | Admitting: Nurse Practitioner

## 2013-07-07 VITALS — BP 125/72 | HR 109 | Temp 98.5°F | Ht 64.0 in | Wt 144.0 lb

## 2013-07-07 DIAGNOSIS — H60399 Other infective otitis externa, unspecified ear: Secondary | ICD-10-CM

## 2013-07-07 DIAGNOSIS — Z7189 Other specified counseling: Secondary | ICD-10-CM

## 2013-07-07 DIAGNOSIS — H6091 Unspecified otitis externa, right ear: Secondary | ICD-10-CM

## 2013-07-07 DIAGNOSIS — Z716 Tobacco abuse counseling: Secondary | ICD-10-CM

## 2013-07-07 DIAGNOSIS — F17200 Nicotine dependence, unspecified, uncomplicated: Secondary | ICD-10-CM

## 2013-07-07 MED ORDER — NEOMYCIN-POLYMYXIN-HC 3.5-10000-1 OT SOLN: 3.0000 [drp] | Freq: Four times a day (QID) | OTIC | Status: DC

## 2013-07-07 NOTE — Progress Notes (Signed)
  Subjective:    Patient ID: Sonia Bailey, female    DOB: 06-Aug-1986, 27 y.o.   MRN: [id number]  HPI EAR PAIN Location:  R ear  Description: 1 week  Onset:  r ear pain and irritation  Modifying factors: + smoker   Symptoms  Sensation of fullness: yes Ear discharge: minimal  URI symptoms: no  Fever: no Tinnitus:no   Dizziness:no   Hearing loss:no   Toothache: no Rashes or lesions: no Facial muscle weakness: no  Red Flags Recent trauma: no PMH prior ear surgery:  no Diabetes or Immunosuppresion: no      Review of Systems  All other systems reviewed and are negative.       Objective:   Physical Exam  Constitutional: She appears well-developed and well-nourished.  HENT:  Head: Normocephalic and atraumatic.  Left Ear: External ear normal.  Mouth/Throat: Oropharynx is clear and moist.  R ear canal erythema and tenderness to otoscopic evaluation.    Eyes: Conjunctivae are normal. Pupils are equal, round, and reactive to light.  Neck: Normal range of motion. Neck supple.  Cardiovascular: Normal rate, regular rhythm and normal heart sounds.   Pulmonary/Chest: Effort normal and breath sounds normal.  Abdominal: Soft.  Musculoskeletal: Normal range of motion.  Neurological: She is alert.  Skin: Skin is warm.          Assessment & Plan:  OE (otitis externa), right - Plan: neomycin-polymyxin-hydrocortisone (CORTISPORIN) otic solution  Tobacco abuse counseling   Will treat with cortisporin.  Discussed general and ENT red flags Discussed smoking cessation.  Follow up as needed.     The patient and/or caregiver has been counseled thoroughly with regard to treatment plan and/or medications prescribed including dosage, schedule, interactions, rationale for use, and possible side effects and they verbalize understanding. Diagnoses and expected course of recovery discussed and will return if not improved as expected or if the condition worsens. Patient and/or  caregiver verbalized understanding.

## 2013-07-12 ENCOUNTER — Telehealth: Payer: Self-pay | Admitting: Nurse Practitioner

## 2013-07-12 NOTE — Telephone Encounter (Signed)
Appt given for tomorrow

## 2013-07-13 ENCOUNTER — Encounter: Payer: Self-pay | Admitting: Nurse Practitioner

## 2013-07-13 ENCOUNTER — Ambulatory Visit (INDEPENDENT_AMBULATORY_CARE_PROVIDER_SITE_OTHER): Payer: Medicaid Other | Admitting: Nurse Practitioner

## 2013-07-13 VITALS — BP 131/76 | HR 108 | Temp 99.4°F | Ht 64.0 in | Wt 147.0 lb

## 2013-07-13 DIAGNOSIS — M549 Dorsalgia, unspecified: Secondary | ICD-10-CM

## 2013-07-13 MED ORDER — CYCLOBENZAPRINE HCL 5 MG PO TABS: 5.0000 mg | ORAL_TABLET | Freq: Three times a day (TID) | ORAL | Status: DC | PRN

## 2013-07-13 MED ORDER — IBUPROFEN 800 MG PO TABS: 800.0000 mg | ORAL_TABLET | Freq: Three times a day (TID) | ORAL | Status: DC | PRN

## 2013-07-13 MED ORDER — METHYLPREDNISOLONE ACETATE 80 MG/ML IJ SUSP
80.0000 mg | Freq: Once | INTRAMUSCULAR | Status: AC
  Administered 2013-07-13: 80 mg via INTRAMUSCULAR

## 2013-07-13 NOTE — Patient Instructions (Addendum)
Back Pain, Adult  Low back pain is very common. About 1 in 5 people have back pain. The cause of low back pain is rarely dangerous. The pain often gets better over time. About half of people with a sudden onset of back pain feel better in just 2 weeks. About 8 in 10 people feel better by 6 weeks.   CAUSES  Some common causes of back pain include:  · Strain of the muscles or ligaments supporting the spine.  · Wear and tear (degeneration) of the spinal discs.  · Arthritis.  · Direct injury to the back.  DIAGNOSIS  Most of the time, the direct cause of low back pain is not known. However, back pain can be treated effectively even when the exact cause of the pain is unknown. Answering your caregiver's questions about your overall health and symptoms is one of the most accurate ways to make sure the cause of your pain is not dangerous. If your caregiver needs more information, he or she may order lab work or imaging tests (X-rays or MRIs). However, even if imaging tests show changes in your back, this usually does not require surgery.  HOME CARE INSTRUCTIONS  For many people, back pain returns. Since low back pain is rarely dangerous, it is often a condition that people can learn to manage on their own.   · Remain active. It is stressful on the back to sit or stand in one place. Do not sit, drive, or stand in one place for more than 30 minutes at a time. Take short walks on level surfaces as soon as pain allows. Try to increase the length of time you walk each day.  · Do not stay in bed. Resting more than 1 or 2 days can delay your recovery.  · Do not avoid exercise or work. Your body is made to move. It is not dangerous to be active, even though your back may hurt. Your back will likely heal faster if you return to being active before your pain is gone.  · Pay attention to your body when you  bend and lift. Many people have less discomfort when lifting if they bend their knees, keep the load close to their bodies, and  avoid twisting. Often, the most comfortable positions are those that put less stress on your recovering back.  · Find a comfortable position to sleep. Use a firm mattress and lie on your side with your knees slightly bent. If you lie on your back, put a pillow under your knees.  · Only take over-the-counter or prescription medicines as directed by your caregiver. Over-the-counter medicines to reduce pain and inflammation are often the most helpful. Your caregiver may prescribe muscle relaxant drugs. These medicines help dull your pain so you can more quickly return to your normal activities and healthy exercise.  · Put ice on the injured area.  · Put ice in a plastic bag.  · Place a towel between your skin and the bag.  · Leave the ice on for 15-20 minutes, 3-4 times a day for the first 2 to 3 days. After that, ice and heat may be alternated to reduce pain and spasms.  · Ask your caregiver about trying back exercises and gentle massage. This may be of some benefit.  · Avoid feeling anxious or stressed. Stress increases muscle tension and can worsen back pain. It is important to recognize when you are anxious or stressed and learn ways to manage it. Exercise is a great option.  SEEK MEDICAL CARE IF:  · You have pain that is not relieved with rest or   medicine.  · You have pain that does not improve in 1 week.  · You have new symptoms.  · You are generally not feeling well.  SEEK IMMEDIATE MEDICAL CARE IF:   · You have pain that radiates from your back into your legs.  · You develop new bowel or bladder control problems.  · You have unusual weakness or numbness in your arms or legs.  · You develop nausea or vomiting.  · You develop abdominal pain.  · You feel faint.  Document Released: 09/09/2005 Document Revised: 03/10/2012 Document Reviewed: 01/28/2011  Exit[employer]® Patient Information ©2014 [employer].

## 2013-07-13 NOTE — Progress Notes (Signed)
  Subjective:    Patient ID: Sonia Bailey, female    DOB: [birth year], 27 y.o.   MRN: [id number]  HPI  Patient was helping her mom out of bathtub and pulled a muscle in her back- happened several days ago- Not getting any better. Pain stays in lower back- does not radiate- rates pain 9/10 constant.Motrin and tylenol OTC some help.    Review of Systems  All other systems reviewed and are negative.       Objective:   Physical Exam  Constitutional: She appears well-developed and well-nourished.  Cardiovascular: Normal rate, regular rhythm and normal heart sounds.   Pulmonary/Chest: Effort normal and breath sounds normal.  Musculoskeletal:  Mid back pain on palpation- FROM pf lumbar spine without pain Motor strength and sensation distally intact.  Neurological: She has normal reflexes.    BP 131/76  Pulse 108  Temp(Src) 99.4 F (37.4 C) (Oral)  Ht 5\' 4"  (1.626 m)  Wt 147 lb (66.679 kg)  BMI 25.22 kg/m2       Assessment & Plan:   1. Back pain    Meds ordered this encounter  Medications  . methylPREDNISolone acetate (DEPO-MEDROL) injection 80 mg    Sig:   . cyclobenzaprine (FLEXERIL) 5 MG tablet    Sig: Take 1 tablet (5 mg total) by mouth 3 (three) times daily as needed for muscle spasms.    Dispense:  30 tablet    Refill:  1    Order Specific Question:  Supervising Provider    Answer:  Ernestina Penna [[id number]]  . ibuprofen (ADVIL,MOTRIN) 800 MG tablet    Sig: Take 1 tablet (800 mg total) by mouth every 8 (eight) hours as needed for pain.    Dispense:  40 tablet    Refill:  1    Order Specific Question:  Supervising Provider    Answer:  Ernestina Penna [[id number]]   Moist heat to lower back Rest No heavy lifting RTO prn  Mary-Margaret Daphine Deutscher, FNP

## 2013-07-23 ENCOUNTER — Ambulatory Visit (INDEPENDENT_AMBULATORY_CARE_PROVIDER_SITE_OTHER): Payer: Medicaid Other | Admitting: Nurse Practitioner

## 2013-07-23 ENCOUNTER — Encounter: Payer: Self-pay | Admitting: Nurse Practitioner

## 2013-07-23 VITALS — BP 127/80 | HR 105 | Temp 98.9°F | Ht 64.0 in | Wt 150.0 lb

## 2013-07-23 DIAGNOSIS — F988 Other specified behavioral and emotional disorders with onset usually occurring in childhood and adolescence: Secondary | ICD-10-CM

## 2013-07-23 DIAGNOSIS — G8929 Other chronic pain: Secondary | ICD-10-CM

## 2013-07-23 DIAGNOSIS — K219 Gastro-esophageal reflux disease without esophagitis: Secondary | ICD-10-CM | POA: Insufficient documentation

## 2013-07-23 DIAGNOSIS — M549 Dorsalgia, unspecified: Secondary | ICD-10-CM

## 2013-07-23 MED ORDER — CYCLOBENZAPRINE HCL 5 MG PO TABS: 5.0000 mg | ORAL_TABLET | Freq: Three times a day (TID) | ORAL | Status: DC | PRN

## 2013-07-23 MED ORDER — TRAMADOL HCL 50 MG PO TABS: 50.0000 mg | ORAL_TABLET | Freq: Three times a day (TID) | ORAL | Status: DC | PRN

## 2013-07-23 MED ORDER — ALPRAZOLAM 1 MG PO TABS: 1.0000 mg | ORAL_TABLET | Freq: Two times a day (BID) | ORAL | Status: DC

## 2013-07-23 MED ORDER — OMEPRAZOLE 20 MG PO CPDR: 20.0000 mg | DELAYED_RELEASE_CAPSULE | Freq: Every day | ORAL | Status: AC

## 2013-07-23 MED ORDER — AMPHETAMINE-DEXTROAMPHET ER 30 MG PO CP24: ORAL_CAPSULE | ORAL | Status: DC

## 2013-07-23 NOTE — Patient Instructions (Addendum)
Health Maintenance, Females A healthy lifestyle and preventative care can promote health and wellness.  Maintain regular health, dental, and eye exams.  Eat a healthy diet. Foods like vegetables, fruits, whole grains, low-fat dairy products, and lean protein foods contain the nutrients you need without too many calories. Decrease your intake of foods high in solid fats, added sugars, and salt. Get information about a proper diet from your caregiver, if necessary.  Regular physical exercise is one of the most important things you can do for your health. Most adults should get at least 150 minutes of moderate-intensity exercise (any activity that increases your heart rate and causes you to sweat) each week. In addition, most adults need muscle-strengthening exercises on 2 or more days a week.   Maintain a healthy weight. The body mass index (BMI) is a screening tool to identify possible weight problems. It provides an estimate of body fat based on height and weight. Your caregiver can help determine your BMI, and can help you achieve or maintain a healthy weight. For adults 20 years and older:  A BMI below 18.5 is considered underweight.  A BMI of 18.5 to 24.9 is normal.  A BMI of 25 to 29.9 is considered overweight.  A BMI of 30 and above is considered obese.  Maintain normal blood lipids and cholesterol by exercising and minimizing your intake of saturated fat. Eat a balanced diet with plenty of fruits and vegetables. Blood tests for lipids and cholesterol should begin at age 20 and be repeated every 5 years. If your lipid or cholesterol levels are high, you are over 50, or you are a high risk for heart disease, you may need your cholesterol levels checked more frequently.Ongoing high lipid and cholesterol levels should be treated with medicines if diet and exercise are not effective.  If you smoke, find out from your caregiver how to quit. If you do not use tobacco, do not start.  If you  are pregnant, do not drink alcohol. If you are breastfeeding, be very cautious about drinking alcohol. If you are not pregnant and choose to drink alcohol, do not exceed 1 drink per day. One drink is considered to be 12 ounces (355 mL) of beer, 5 ounces (148 mL) of wine, or 1.5 ounces (44 mL) of liquor.  Avoid use of street drugs. Do not share needles with anyone. Ask for help if you need support or instructions about stopping the use of drugs.  High blood pressure causes heart disease and increases the risk of stroke. Blood pressure should be checked at least every 1 to 2 years. Ongoing high blood pressure should be treated with medicines, if weight loss and exercise are not effective.  If you are 55 to 29 years old, ask your caregiver if you should take aspirin to prevent strokes.  Diabetes screening involves taking a blood sample to check your fasting blood sugar level. This should be done once every 3 years, after age 45, if you are within normal weight and without risk factors for diabetes. Testing should be considered at a younger age or be carried out more frequently if you are overweight and have at least 1 risk factor for diabetes.  Breast cancer screening is essential preventative care for women. You should practice "breast self-awareness." This means understanding the normal appearance and feel of your breasts and may include breast self-examination. Any changes detected, no matter how small, should be reported to a caregiver. Women in their 20s and 30s should have   a clinical breast exam (CBE) by a caregiver as part of a regular health exam every 1 to 3 years. After age 40, women should have a CBE every year. Starting at age 40, women should consider having a mammogram (breast X-ray) every year. Women who have a family history of breast cancer should talk to their caregiver about genetic screening. Women at a high risk of breast cancer should talk to their caregiver about having an MRI and a  mammogram every year.  The Pap test is a screening test for cervical cancer. Women should have a Pap test starting at age 21. Between ages 21 and 29, Pap tests should be repeated every 2 years. Beginning at age 30, you should have a Pap test every 3 years as long as the past 3 Pap tests have been normal. If you had a hysterectomy for a problem that was not cancer or a condition that could lead to cancer, then you no longer need Pap tests. If you are between ages 65 and 70, and you have had normal Pap tests going back 10 years, you no longer need Pap tests. If you have had past treatment for cervical cancer or a condition that could lead to cancer, you need Pap tests and screening for cancer for at least 20 years after your treatment. If Pap tests have been discontinued, risk factors (such as a new sexual partner) need to be reassessed to determine if screening should be resumed. Some women have medical problems that increase the chance of getting cervical cancer. In these cases, your caregiver may recommend more frequent screening and Pap tests.  The human papillomavirus (HPV) test is an additional test that may be used for cervical cancer screening. The HPV test looks for the virus that can cause the cell changes on the cervix. The cells collected during the Pap test can be tested for HPV. The HPV test could be used to screen women aged 30 years and older, and should be used in women of any age who have unclear Pap test results. After the age of 30, women should have HPV testing at the same frequency as a Pap test.  Colorectal cancer can be detected and often prevented. Most routine colorectal cancer screening begins at the age of 50 and continues through age 75. However, your caregiver may recommend screening at an earlier age if you have risk factors for colon cancer. On a yearly basis, your caregiver may provide home test kits to check for hidden blood in the stool. Use of a small camera at the end of a  tube, to directly examine the colon (sigmoidoscopy or colonoscopy), can detect the earliest forms of colorectal cancer. Talk to your caregiver about this at age 50, when routine screening begins. Direct examination of the colon should be repeated every 5 to 10 years through age 75, unless early forms of pre-cancerous polyps or small growths are found.  Hepatitis C blood testing is recommended for all people born from 1945 through 1965 and any individual with known risks for hepatitis C.  Practice safe sex. Use condoms and avoid high-risk sexual practices to reduce the spread of sexually transmitted infections (STIs). Sexually active women aged 25 and younger should be checked for Chlamydia, which is a common sexually transmitted infection. Older women with new or multiple partners should also be tested for Chlamydia. Testing for other STIs is recommended if you are sexually active and at increased risk.  Osteoporosis is a disease in which the   bones lose minerals and strength with aging. This can result in serious bone fractures. The risk of osteoporosis can be identified using a bone density scan. Women ages 86 and over and women at risk for fractures or osteoporosis should discuss screening with their caregivers. Ask your caregiver whether you should be taking a calcium supplement or vitamin D to reduce the rate of osteoporosis.  Menopause can be associated with physical symptoms and risks. Hormone replacement therapy is available to decrease symptoms and risks. You should talk to your caregiver about whether hormone replacement therapy is right for you.  Use sunscreen with a sun protection factor (SPF) of 30 or greater. Apply sunscreen liberally and repeatedly throughout the day. You should seek shade when your shadow is shorter than you. Protect yourself by wearing long sleeves, pants, a wide-brimmed hat, and sunglasses year round, whenever you are outdoors.  Notify your caregiver of new moles or  changes in moles, especially if there is a change in shape or color. Also notify your caregiver if a mole is larger than the size of a pencil eraser.  Stay current with your immunizations. Document Released: 03/25/2011 Document Revised: 12/02/2011 Document Reviewed: 03/25/2011 [facility] Patient Information 2014 [place], [place].Smoking Cessation Quitting smoking is important to your health and has many advantages. However, it is not always easy to quit since nicotine is a very addictive drug. Often times, people try 3 times or more before being able to quit. This document explains the best ways for you to prepare to quit smoking. Quitting takes hard work and a lot of effort, but you can do it. ADVANTAGES OF QUITTING SMOKING  You will live longer, feel better, and live better.  Your body will feel the impact of quitting smoking almost immediately.  Within 20 minutes, blood pressure decreases. Your pulse returns to its normal level.  After 8 hours, carbon monoxide levels in the blood return to normal. Your oxygen level increases.  After 24 hours, the chance of having a heart attack starts to decrease. Your breath, hair, and body stop smelling like smoke.  After 48 hours, damaged nerve endings begin to recover. Your sense of taste and smell improve.  After 72 hours, the body is virtually free of nicotine. Your bronchial tubes relax and breathing becomes easier.  After 2 to 12 weeks, lungs can hold more air. Exercise becomes easier and circulation improves.  The risk of having a heart attack, stroke, cancer, or lung disease is greatly reduced.  After 1 year, the risk of coronary heart disease is cut in half.  After 5 years, the risk of stroke falls to the same as a nonsmoker.  After 10 years, the risk of lung cancer is cut in half and the risk of other cancers decreases significantly.  After 15 years, the risk of coronary heart disease drops, usually to the level of a nonsmoker.  If you  are pregnant, quitting smoking will improve your chances of having a healthy baby.  The people you live with, especially any children, will be healthier.  You will have extra money to spend on things other than cigarettes. QUESTIONS TO THINK ABOUT BEFORE ATTEMPTING TO QUIT You may want to talk about your answers with your caregiver.  Why do you want to quit?  If you tried to quit in the past, what helped and what did not?  What will be the most difficult situations for you after you quit? How will you plan to handle them?  Who can help  you through the tough times? Your family? Friends? A caregiver?  What pleasures do you get from smoking? What ways can you still get pleasure if you quit? Here are some questions to ask your caregiver:  How can you help me to be successful at quitting?  What medicine do you think would be best for me and how should I take it?  What should I do if I need more help?  What is smoking withdrawal like? How can I get information on withdrawal? GET READY  Set a quit date.  Change your environment by getting rid of all cigarettes, ashtrays, matches, and lighters in your home, car, or work. Do not let people smoke in your home.  Review your past attempts to quit. Think about what worked and what did not. GET SUPPORT AND ENCOURAGEMENT You have a better chance of being successful if you have help. You can get support in many ways.  Tell your family, friends, and co-workers that you are going to quit and need their support. Ask them not to smoke around you.  Get individual, group, or telephone counseling and support. Programs are available at [employer] and health centers. Call your local health department for information about programs in your area.  Spiritual beliefs and practices may help some smokers quit.  Download a "quit meter" on your computer to keep track of quit statistics, such as how long you have gone without smoking, cigarettes not  smoked, and money saved.  Get a self-help book about quitting smoking and staying off of tobacco. LEARN NEW SKILLS AND BEHAVIORS  Distract yourself from urges to smoke. Talk to someone, go for a walk, or occupy your time with a task.  Change your normal routine. Take a different route to work. Drink tea instead of coffee. Eat breakfast in a different place.  Reduce your stress. Take a hot bath, exercise, or read a book.  Plan something enjoyable to do every day. Reward yourself for not smoking.  Explore interactive web-based programs that specialize in helping you quit. GET MEDICINE AND USE IT CORRECTLY Medicines can help you stop smoking and decrease the urge to smoke. Combining medicine with the above behavioral methods and support can greatly increase your chances of successfully quitting smoking.  Nicotine replacement therapy helps deliver nicotine to your body without the negative effects and risks of smoking. Nicotine replacement therapy includes nicotine gum, lozenges, inhalers, nasal sprays, and skin patches. Some may be available over-the-counter and others require a prescription.  Antidepressant medicine helps people abstain from smoking, but how this works is unknown. This medicine is available by prescription.  Nicotinic receptor partial agonist medicine simulates the effect of nicotine in your brain. This medicine is available by prescription. Ask your caregiver for advice about which medicines to use and how to use them based on your health history. Your caregiver will tell you what side effects to look out for if you choose to be on a medicine or therapy. Carefully read the information on the package. Do not use any other product containing nicotine while using a nicotine replacement product.  RELAPSE OR DIFFICULT SITUATIONS Most relapses occur within the first 3 months after quitting. Do not be discouraged if you start smoking again. Remember, most people try several times  before finally quitting. You may have symptoms of withdrawal because your body is used to nicotine. You may crave cigarettes, be irritable, feel very hungry, cough often, get headaches, or have difficulty concentrating. The withdrawal symptoms are  only temporary. They are strongest when you first quit, but they will go away within 10 14 days. To reduce the chances of relapse, try to:  Avoid drinking alcohol. Drinking lowers your chances of successfully quitting.  Reduce the amount of caffeine you consume. Once you quit smoking, the amount of caffeine in your body increases and can give you symptoms, such as a rapid heartbeat, sweating, and anxiety.  Avoid smokers because they can make you want to smoke.  Do not let weight gain distract you. Many smokers will gain weight when they quit, usually less than 10 pounds. Eat a healthy diet and stay active. You can always lose the weight gained after you quit.  Find ways to improve your mood other than smoking. FOR MORE INFORMATION  www.smokefree.gov  Document Released: 09/03/2001 Document Revised: 03/10/2012 Document Reviewed: 12/19/2011 [facility] Patient Information 2014 [place], [place].

## 2013-07-23 NOTE — Progress Notes (Signed)
Subjective:    Patient ID: Sonia Bailey, female    DOB: September 11, 1986, 27 y.o.   MRN: [id number]  Gastrophageal Reflux She reports no belching, no chest pain, no coughing, no heartburn or no nausea. This is a chronic problem. The current episode started more than 1 year ago. The problem occurs frequently. The problem has been unchanged. The heartburn duration is an hour. The heartburn is located in the substernum. The heartburn is of moderate intensity. The heartburn wakes her from sleep. The heartburn does not limit her activity. The symptoms are aggravated by smoking and caffeine. Pertinent negatives include no fatigue, muscle weakness or weight loss. Risk factors include hiatal hernia and smoking/tobacco exposure. She has tried a PPI for the symptoms. The treatment provided significant relief. Past procedures include an EGD.  Back Pain This is a chronic problem. The current episode started more than 1 year ago. The problem occurs daily. The problem is unchanged. The pain is present in the lumbar spine. The quality of the pain is described as stabbing and aching. The pain does not radiate. The pain is at a severity of 8/10. The pain is moderate. The pain is the same all the time. The symptoms are aggravated by sitting and position. Stiffness is present in the morning. Pertinent negatives include no bladder incontinence, chest pain, headaches, weakness or weight loss. She has tried heat, NSAIDs, walking, bed rest and ice for the symptoms. The treatment provided mild relief.  Anxiety Onset was 1 to 5 years ago. The problem has been unchanged. Symptoms include nervous/anxious behavior. Patient reports no chest pain, depressed mood, excessive worry, insomnia, irritability, nausea, palpitations or panic. Symptoms occur most days. The severity of symptoms is moderate. The symptoms are aggravated by family issues. The quality of sleep is good. Nighttime awakenings: several.   There is no history of  anxiety/panic attacks or depression. Past treatments include benzodiazephines. The treatment provided significant relief. Compliance with prior treatments has been good.   ADHD This is a chronic problem. Patient has been taking adderall for over 8 years.- Working well at this time.    Review of Systems  Constitutional: Negative.  Negative for weight loss, irritability and fatigue.  Respiratory: Negative.  Negative for cough.   Cardiovascular: Negative.  Negative for chest pain and palpitations.  Gastrointestinal: Negative for heartburn and nausea.  Genitourinary: Negative for bladder incontinence.  Musculoskeletal: Positive for back pain. Negative for muscle weakness.  Neurological: Negative for weakness and headaches.  Psychiatric/Behavioral: The patient is nervous/anxious. The patient does not have insomnia.   All other systems reviewed and are negative.  BP 127/80  Pulse 105  Temp(Src) 98.9 F (37.2 C) (Oral)  Ht 5\' 4"  (1.626 m)  Wt 150 lb (68.04 kg)  BMI 25.73 kg/m2      Objective:   Physical Exam  Vitals reviewed. Constitutional: She is oriented to person, place, and time. She appears well-developed and well-nourished.  HENT:  Nose: Nose normal.  Mouth/Throat: Oropharynx is clear and moist.  Eyes: EOM are normal.  Neck: Trachea normal, normal range of motion and full passive range of motion without pain. Neck supple. No JVD present. Carotid bruit is not present. No thyromegaly present.  Cardiovascular: Normal rate, regular rhythm, normal heart sounds and intact distal pulses.  Exam reveals no gallop and no friction rub.   No murmur heard. Pulmonary/Chest: Effort normal and breath sounds normal.  Abdominal: Soft. Bowel sounds are normal. She exhibits no distension and no mass. There is no  tenderness.  Musculoskeletal: Normal range of motion.  Lymphadenopathy:    She has no cervical adenopathy.  Neurological: She is alert and oriented to person, place, and time. She has  normal reflexes.  Skin: Skin is warm and dry.  Psychiatric: She has a normal mood and affect. Her behavior is normal. Judgment and thought content normal.    BP 127/80  Pulse 105  Temp(Src) 98.9 F (37.2 C) (Oral)  Ht 5\' 4"  (1.626 m)  Wt 150 lb (68.04 kg)  BMI 25.73 kg/m2       Assessment & Plan:   1. GERD, SEVERE   2. 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 GERD (gastroesophageal reflux disease)     Meds ordered this encounter  Medications  . omeprazole (PRILOSEC) 20 MG capsule    Sig: Take 1 capsule (20 mg total) by mouth daily.    Dispense:  30 capsule    Refill:  5    Order Specific Question:  Supervising Provider    Answer:  Ernestina Penna [[id number]]  . amphetamine-dextroamphetamine (ADDERALL XR) 30 MG 24 hr capsule    Sig: 1 PO BID    Dispense:  60 capsule    Refill:  0    DO NOT FILL TILL 07/30/13    Order Specific Question:  Supervising Provider    Answer:  Ernestina Penna [[id number]]  . ALPRAZolam (XANAX) 1 MG tablet    Sig: Take 1 tablet (1 mg total) by mouth 2 (two) times daily.    Dispense:  60 tablet    Refill:  1    Order Specific Question:  Supervising Provider    Answer:  Ernestina Penna [[id number]]  . cyclobenzaprine (FLEXERIL) 5 MG tablet    Sig: Take 1 tablet (5 mg total) by mouth 3 (three) times daily as needed for muscle spasms.    Dispense:  30 tablet    Refill:  1    Order Specific Question:  Supervising Provider    Answer:  Deborra Medina    Continue all meds Diet and exercise encouraged Health maintenance reviewed Follow up in 3 months  Mary-Margaret Daphine Deutscher, FNP

## 2013-07-23 NOTE — Addendum Note (Signed)
Addended by: Bennie Pierini on: 07/23/2013 03:46 PM   Modules accepted: Orders

## 2013-08-09 ENCOUNTER — Other Ambulatory Visit: Payer: Self-pay | Admitting: Nurse Practitioner

## 2013-08-23 ENCOUNTER — Telehealth: Payer: Self-pay | Admitting: Nurse Practitioner

## 2013-08-23 MED ORDER — AMPHETAMINE-DEXTROAMPHET ER 30 MG PO CP24: ORAL_CAPSULE | ORAL | Status: DC

## 2013-08-23 NOTE — Telephone Encounter (Signed)
rx ready for pickup 

## 2013-08-24 NOTE — Telephone Encounter (Signed)
Patient aware to pick up 

## 2013-08-27 ENCOUNTER — Telehealth: Payer: Self-pay | Admitting: Nurse Practitioner

## 2013-08-27 DIAGNOSIS — G8929 Other chronic pain: Secondary | ICD-10-CM

## 2013-08-27 MED ORDER — TRAMADOL HCL 50 MG PO TABS: 50.0000 mg | ORAL_TABLET | Freq: Three times a day (TID) | ORAL | Status: DC | PRN

## 2013-08-27 NOTE — Telephone Encounter (Signed)
rx ready for pick up- if going to continue to need pain meds will have to refer to specialist

## 2013-08-27 NOTE — Telephone Encounter (Signed)
Pt aware.

## 2013-09-05 ENCOUNTER — Other Ambulatory Visit: Payer: Self-pay | Admitting: Nurse Practitioner

## 2013-09-08 NOTE — Telephone Encounter (Signed)
Please clal in xanax rx with 1 refill

## 2013-09-08 NOTE — Telephone Encounter (Signed)
rx called into pharmacy

## 2013-09-08 NOTE — Telephone Encounter (Signed)
Last seen 07/23/13  MMM If approved route to nurse to call into [facility] 

## 2013-09-09 ENCOUNTER — Other Ambulatory Visit: Payer: Self-pay | Admitting: Nurse Practitioner

## 2013-09-21 ENCOUNTER — Other Ambulatory Visit: Payer: Self-pay | Admitting: Family Medicine

## 2013-09-21 ENCOUNTER — Other Ambulatory Visit: Payer: Self-pay | Admitting: Nurse Practitioner

## 2013-09-24 NOTE — Telephone Encounter (Signed)
Last seen 07/23/13  MMM If approved print and route to nurse 

## 2013-09-27 ENCOUNTER — Telehealth: Payer: Self-pay | Admitting: Nurse Practitioner

## 2013-09-27 MED ORDER — AMPHETAMINE-DEXTROAMPHET ER 30 MG PO CP24: ORAL_CAPSULE | ORAL | Status: DC

## 2013-09-27 NOTE — Telephone Encounter (Signed)
rx ready for pickup 

## 2013-09-27 NOTE — Telephone Encounter (Signed)
Patient aware rx ready to pick up 

## 2013-09-27 NOTE — Telephone Encounter (Signed)
Script at front

## 2013-10-05 ENCOUNTER — Telehealth: Payer: Self-pay | Admitting: Nurse Practitioner

## 2013-10-06 MED ORDER — RIZATRIPTAN BENZOATE 10 MG PO TABS: 10.0000 mg | ORAL_TABLET | ORAL | Status: DC | PRN

## 2013-10-06 NOTE — Telephone Encounter (Signed)
Dr. Douglass [place]Mcgallan in [place]. Only sees pediatric pts now.

## 2013-10-06 NOTE — Telephone Encounter (Signed)
Not on med list who rx in past

## 2013-10-13 ENCOUNTER — Other Ambulatory Visit: Payer: Self-pay | Admitting: Nurse Practitioner

## 2013-10-13 ENCOUNTER — Telehealth: Payer: Self-pay | Admitting: Nurse Practitioner

## 2013-10-13 DIAGNOSIS — M549 Dorsalgia, unspecified: Principal | ICD-10-CM

## 2013-10-13 DIAGNOSIS — G8929 Other chronic pain: Secondary | ICD-10-CM

## 2013-10-13 NOTE — Telephone Encounter (Signed)
Referral has been made but can take a while to hear something-

## 2013-10-13 NOTE — Telephone Encounter (Signed)
Pt.notified

## 2013-10-18 ENCOUNTER — Telehealth: Payer: Self-pay | Admitting: Nurse Practitioner

## 2013-10-18 NOTE — Telephone Encounter (Signed)
Need to wait on pain clinic referral- will not do more then tramadol

## 2013-10-18 NOTE — Telephone Encounter (Signed)
Pt notified that [facility]MM wants pt to wait on pain clinic referral [facility]MM will not rx anything stronger than Tramadol per note

## 2013-10-22 ENCOUNTER — Telehealth: Payer: Self-pay | Admitting: Nurse Practitioner

## 2013-10-22 ENCOUNTER — Other Ambulatory Visit: Payer: Self-pay | Admitting: Nurse Practitioner

## 2013-10-22 MED ORDER — AMPHETAMINE-DEXTROAMPHET ER 30 MG PO CP24: ORAL_CAPSULE | ORAL | Status: DC

## 2013-10-22 NOTE — Telephone Encounter (Signed)
Patient aware.

## 2013-10-22 NOTE — Telephone Encounter (Signed)
rx ready for pick up no more refills without being seen  

## 2013-10-26 NOTE — Telephone Encounter (Signed)
Last seen 07/23/13  MMM If approved print and route to nurse 

## 2013-10-26 NOTE — Telephone Encounter (Signed)
rx ready for pickup 

## 2013-10-29 ENCOUNTER — Other Ambulatory Visit: Payer: Self-pay | Admitting: Nurse Practitioner

## 2013-11-01 NOTE — Telephone Encounter (Signed)
rx ready for pick up- if continues to need will have to do referral to pain management

## 2013-11-01 NOTE — Telephone Encounter (Signed)
Patient aware to pick up 

## 2013-11-01 NOTE — Telephone Encounter (Signed)
Last seen 07/23/13  MMM If approved print and route to nurse 

## 2013-11-02 ENCOUNTER — Other Ambulatory Visit: Payer: Self-pay | Admitting: Nurse Practitioner

## 2013-11-03 NOTE — Telephone Encounter (Signed)
Please call in xanax with 1 refills 

## 2013-11-03 NOTE — Telephone Encounter (Signed)
Patient last seen on 07-13-13. Rx last filled on 08-03-14 for the alprazolam and 10-06-13 for the maxalt. Please advise. If approved [facility] so nurse can phone in to pharmacy

## 2013-11-04 NOTE — Telephone Encounter (Signed)
Aware, [facility]

## 2013-11-15 ENCOUNTER — Other Ambulatory Visit: Payer: Self-pay | Admitting: Nurse Practitioner

## 2013-11-17 NOTE — Telephone Encounter (Signed)
Tramadol last filled 11/02/13, last seen 07/23/13. Rx will print

## 2013-11-19 ENCOUNTER — Telehealth: Payer: Self-pay | Admitting: *Deleted

## 2013-11-19 NOTE — Telephone Encounter (Signed)
Prescription ready

## 2013-11-22 ENCOUNTER — Telehealth: Payer: Self-pay | Admitting: Nurse Practitioner

## 2013-11-22 MED ORDER — AMPHETAMINE-DEXTROAMPHET ER 30 MG PO CP24: ORAL_CAPSULE | ORAL | Status: DC

## 2013-11-22 NOTE — Telephone Encounter (Signed)
rx ready for pick up ntbs for future refills   

## 2013-11-22 NOTE — Telephone Encounter (Signed)
Patient aware.

## 2013-12-22 ENCOUNTER — Telehealth: Payer: Self-pay | Admitting: Nurse Practitioner

## 2013-12-22 ENCOUNTER — Other Ambulatory Visit: Payer: Self-pay | Admitting: Family Medicine

## 2013-12-23 ENCOUNTER — Other Ambulatory Visit: Payer: Self-pay | Admitting: *Deleted

## 2013-12-23 MED ORDER — ALPRAZOLAM 1 MG PO TABS: ORAL_TABLET | ORAL | Status: DC

## 2013-12-23 MED ORDER — TRAMADOL HCL 50 MG PO TABS: ORAL_TABLET | ORAL | Status: DC

## 2013-12-23 NOTE — Telephone Encounter (Signed)
Cannot fill until I return on Monday

## 2013-12-23 NOTE — Telephone Encounter (Signed)
Patient of MMM last seen in office on 07-23-13. Rx last filled on 11-22-13. Please advise. If approved please print and route to Pool A so nurse can call patient to pick up

## 2013-12-23 NOTE — Telephone Encounter (Signed)
Rx ready for pick up. 

## 2013-12-23 NOTE — Telephone Encounter (Signed)
Patient last seen in office on 07-23-13. Rx last filled on 11-29-13. Please advise. If approved please route to Pool B [facility] [place]

## 2013-12-27 ENCOUNTER — Encounter: Payer: Self-pay | Admitting: Physical Medicine & Rehabilitation

## 2013-12-27 NOTE — Telephone Encounter (Signed)
Sonia Bailey has done

## 2013-12-27 NOTE — Telephone Encounter (Signed)
Wait for hard copy

## 2013-12-28 ENCOUNTER — Telehealth: Payer: Self-pay | Admitting: Nurse Practitioner

## 2013-12-28 MED ORDER — AMPHETAMINE-DEXTROAMPHET ER 30 MG PO CP24: ORAL_CAPSULE | ORAL | Status: DC

## 2013-12-28 NOTE — Telephone Encounter (Signed)
rx ready for pick up  No more refills without being seen 

## 2013-12-29 NOTE — Telephone Encounter (Signed)
Prescription is up front for pickup 

## 2014-01-04 ENCOUNTER — Other Ambulatory Visit: Payer: Self-pay | Admitting: Family Medicine

## 2014-01-06 ENCOUNTER — Other Ambulatory Visit: Payer: Self-pay | Admitting: Nurse Practitioner

## 2014-01-06 MED ORDER — ALPRAZOLAM 1 MG PO TABS: ORAL_TABLET | ORAL | Status: DC

## 2014-01-17 ENCOUNTER — Other Ambulatory Visit: Payer: Self-pay

## 2014-01-17 MED ORDER — IBUPROFEN 800 MG PO TABS: 800.0000 mg | ORAL_TABLET | Freq: Three times a day (TID) | ORAL | Status: DC | PRN

## 2014-01-17 NOTE — Telephone Encounter (Signed)
Last seen 07/23/13  MMM If approved print and route to nurse

## 2014-01-17 NOTE — Telephone Encounter (Signed)
Last seen 07/23/13  MMM 

## 2014-01-18 ENCOUNTER — Other Ambulatory Visit: Payer: Self-pay

## 2014-01-18 MED ORDER — TRAMADOL HCL 50 MG PO TABS: ORAL_TABLET | ORAL | Status: DC

## 2014-01-18 NOTE — Telephone Encounter (Signed)
Last seen 07/23/13  MM If approved print and route to nurse

## 2014-01-18 NOTE — Telephone Encounter (Signed)
rx ready for pick up- if going to continue to stay on pain medds will need to be referred to pain management

## 2014-01-19 NOTE — Telephone Encounter (Signed)
RX called in .

## 2014-01-20 ENCOUNTER — Other Ambulatory Visit: Payer: Self-pay | Admitting: Family Medicine

## 2014-01-20 ENCOUNTER — Telehealth: Payer: Self-pay | Admitting: Nurse Practitioner

## 2014-01-20 MED ORDER — AMPHETAMINE-DEXTROAMPHET ER 30 MG PO CP24: ORAL_CAPSULE | ORAL | Status: DC

## 2014-01-20 NOTE — Telephone Encounter (Signed)
Prescription available up front to be picked up.

## 2014-01-20 NOTE — Telephone Encounter (Signed)
ntbs for future refills rx ready for pick up

## 2014-01-24 ENCOUNTER — Other Ambulatory Visit: Payer: Self-pay | Admitting: Family Medicine

## 2014-01-27 ENCOUNTER — Other Ambulatory Visit: Payer: Self-pay | Admitting: *Deleted

## 2014-01-27 MED ORDER — RIZATRIPTAN BENZOATE 10 MG PO TABS: ORAL_TABLET | ORAL | Status: DC

## 2014-01-28 ENCOUNTER — Other Ambulatory Visit: Payer: Self-pay

## 2014-01-28 NOTE — Telephone Encounter (Signed)
ZNTBS for xanax refill- hasn't been seen since last November

## 2014-01-28 NOTE — Telephone Encounter (Signed)
Last seen 07/23/13  MMM If approved route [facility]

## 2014-01-31 ENCOUNTER — Telehealth: Payer: Self-pay | Admitting: Nurse Practitioner

## 2014-01-31 MED ORDER — ALPRAZOLAM 1 MG PO TABS: ORAL_TABLET | ORAL | Status: DC

## 2014-01-31 NOTE — Telephone Encounter (Signed)
Aware. 

## 2014-01-31 NOTE — Telephone Encounter (Signed)
rx ready for pick up- has to pick up because can not fill until 02/03/14

## 2014-02-07 ENCOUNTER — Encounter: Payer: Self-pay | Admitting: Physical Medicine & Rehabilitation

## 2014-02-07 ENCOUNTER — Ambulatory Visit (HOSPITAL_BASED_OUTPATIENT_CLINIC_OR_DEPARTMENT_OTHER): Payer: Medicaid Other | Admitting: Physical Medicine & Rehabilitation

## 2014-02-07 ENCOUNTER — Encounter: Payer: Medicaid Other | Attending: Physical Medicine & Rehabilitation

## 2014-02-07 VITALS — BP 129/72 | HR 112 | Resp 14 | Wt 149.0 lb

## 2014-02-07 DIAGNOSIS — F17200 Nicotine dependence, unspecified, uncomplicated: Secondary | ICD-10-CM | POA: Insufficient documentation

## 2014-02-07 DIAGNOSIS — M545 Low back pain, unspecified: Secondary | ICD-10-CM | POA: Insufficient documentation

## 2014-02-07 DIAGNOSIS — Z79899 Other long term (current) drug therapy: Secondary | ICD-10-CM

## 2014-02-07 DIAGNOSIS — G8929 Other chronic pain: Secondary | ICD-10-CM | POA: Insufficient documentation

## 2014-02-07 DIAGNOSIS — Z5181 Encounter for therapeutic drug level monitoring: Secondary | ICD-10-CM

## 2014-02-07 MED ORDER — MELOXICAM 7.5 MG PO TABS: 7.5000 mg | ORAL_TABLET | Freq: Every day | ORAL | Status: DC

## 2014-02-07 MED ORDER — CYCLOBENZAPRINE HCL 5 MG PO TABS: 5.0000 mg | ORAL_TABLET | Freq: Three times a day (TID) | ORAL | Status: DC | PRN

## 2014-02-07 MED ORDER — TRAMADOL HCL 50 MG PO TABS: ORAL_TABLET | ORAL | Status: DC

## 2014-02-07 NOTE — Patient Instructions (Signed)
Back Exercises These exercises may help you when beginning to rehabilitate your injury. Your symptoms may resolve with or without further involvement from your physician, physical therapist or athletic trainer. While completing these exercises, remember:   Restoring tissue flexibility helps normal motion to return to the joints. This allows healthier, less painful movement and activity.  An effective stretch should be held for at least 30 seconds.  A stretch should never be painful. You should only feel a gentle lengthening or release in the stretched tissue. STRETCH  Extension, Prone on Elbows   Lie on your stomach on the floor, a bed will be too soft. Place your palms about shoulder width apart and at the height of your head.  Place your elbows under your shoulders. If this is too painful, stack pillows under your chest.  Allow your body to relax so that your hips drop lower and make contact more completely with the floor.  Hold this position for __________ seconds.  Slowly return to lying flat on the floor. Repeat __________ times. Complete this exercise __________ times per day.  RANGE OF MOTION  Extension, Prone Press Ups   Lie on your stomach on the floor, a bed will be too soft. Place your palms about shoulder width apart and at the height of your head.  Keeping your back as relaxed as possible, slowly straighten your elbows while keeping your hips on the floor. You may adjust the placement of your hands to maximize your comfort. As you gain motion, your hands will come more underneath your shoulders.  Hold this position __________ seconds.  Slowly return to lying flat on the floor. Repeat __________ times. Complete this exercise __________ times per day.  RANGE OF MOTION- Quadruped, Neutral Spine   Assume a hands and knees position on a firm surface. Keep your hands under your shoulders and your knees under your hips. You may place padding under your knees for comfort.  Drop  your head and point your tail bone toward the ground below you. This will round out your low back like an angry cat. Hold this position for __________ seconds.  Slowly lift your head and release your tail bone so that your back sags into a large arch, like an old horse.  Hold this position for __________ seconds.  Repeat this until you feel limber in your low back.  Now, find your "sweet spot." This will be the most comfortable position somewhere between the two previous positions. This is your neutral spine. Once you have found this position, tense your stomach muscles to support your low back.  Hold this position for __________ seconds. Repeat __________ times. Complete this exercise __________ times per day.  STRETCH  Flexion, Single Knee to Chest   Lie on a firm bed or floor with both legs extended in front of you.  Keeping one leg in contact with the floor, bring your opposite knee to your chest. Hold your leg in place by either grabbing behind your thigh or at your knee.  Pull until you feel a gentle stretch in your low back. Hold __________ seconds.  Slowly release your grasp and repeat the exercise with the opposite side. Repeat __________ times. Complete this exercise __________ times per day.  STRETCH - Hamstrings, Standing  Stand or sit and extend your right / left leg, placing your foot on a chair or foot stool  Keeping a slight arch in your low back and your hips straight forward.  Lead with your chest and   lean forward at the waist until you feel a gentle stretch in the back of your right / left knee or thigh. (When done correctly, this exercise requires leaning only a small distance.)  Hold this position for __________ seconds. Repeat __________ times. Complete this stretch __________ times per day. STRENGTHENING  Deep Abdominals, Pelvic Tilt   Lie on a firm bed or floor. Keeping your legs in front of you, bend your knees so they are both pointed toward the ceiling and  your feet are flat on the floor.  Tense your lower abdominal muscles to press your low back into the floor. This motion will rotate your pelvis so that your tail bone is scooping upwards rather than pointing at your feet or into the floor.  With a gentle tension and even breathing, hold this position for __________ seconds. Repeat __________ times. Complete this exercise __________ times per day.  STRENGTHENING  Abdominals, Crunches   Lie on a firm bed or floor. Keeping your legs in front of you, bend your knees so they are both pointed toward the ceiling and your feet are flat on the floor. Cross your arms over your chest.  Slightly tip your chin down without bending your neck.  Tense your abdominals and slowly lift your trunk high enough to just clear your shoulder blades. Lifting higher can put excessive stress on the low back and does not further strengthen your abdominal muscles.  Control your return to the starting position. Repeat __________ times. Complete this exercise __________ times per day.  STRENGTHENING  Quadruped, Opposite UE/LE Lift   Assume a hands and knees position on a firm surface. Keep your hands under your shoulders and your knees under your hips. You may place padding under your knees for comfort.  Find your neutral spine and gently tense your abdominal muscles so that you can maintain this position. Your shoulders and hips should form a rectangle that is parallel with the floor and is not twisted.  Keeping your trunk steady, lift your right hand no higher than your shoulder and then your left leg no higher than your hip. Make sure you are not holding your breath. Hold this position __________ seconds.  Continuing to keep your abdominal muscles tense and your back steady, slowly return to your starting position. Repeat with the opposite arm and leg. Repeat __________ times. Complete this exercise __________ times per day. Document Released: 09/27/2005 Document  Revised: 12/02/2011 Document Reviewed: 12/22/2008 [employer] Patient Information 2014 [employer].  
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
## 2014-02-18 ENCOUNTER — Telehealth: Payer: Self-pay

## 2014-02-18 NOTE — Telephone Encounter (Signed)
Urine drug screen from 02/07/2014 was positive for morphine, making it inconsistent.

## 2014-02-18 NOTE — Telephone Encounter (Signed)
Please inform pt of non narcotic tx only

## 2014-02-18 NOTE — Telephone Encounter (Signed)
Patient informed she will non-narcotic treatment.

## 2014-02-21 ENCOUNTER — Other Ambulatory Visit: Payer: Self-pay | Admitting: Nurse Practitioner

## 2014-02-21 ENCOUNTER — Telehealth: Payer: Self-pay | Admitting: Nurse Practitioner

## 2014-02-21 MED ORDER — AMPHETAMINE-DEXTROAMPHET ER 30 MG PO CP24: ORAL_CAPSULE | ORAL | Status: DC

## 2014-02-21 NOTE — Telephone Encounter (Signed)
Up front to pick up 

## 2014-02-21 NOTE — Telephone Encounter (Signed)
rx ready for pick up  No more refills without being seen 

## 2014-03-04 ENCOUNTER — Telehealth: Payer: Self-pay

## 2014-03-04 ENCOUNTER — Other Ambulatory Visit: Payer: Self-pay | Admitting: Nurse Practitioner

## 2014-03-04 ENCOUNTER — Other Ambulatory Visit: Payer: Self-pay | Admitting: Physical Medicine & Rehabilitation

## 2014-03-04 NOTE — Telephone Encounter (Signed)
Per Dr. Wynn BankerKirsteins Tramadol refill [facility]. Attempted to contact patient to inform her that the refill has been sent to pharmacy. Left a voicemail.

## 2014-03-06 ENCOUNTER — Other Ambulatory Visit: Payer: Self-pay | Admitting: Nurse Practitioner

## 2014-03-07 NOTE — Telephone Encounter (Signed)
Requesting ibuprofen. This is not on her current medication list. Last filled 01/17/14.

## 2014-03-08 NOTE — Telephone Encounter (Signed)
Called in.

## 2014-03-08 NOTE — Telephone Encounter (Signed)
Last refilled 02/03/14. If approved route to [facility].

## 2014-03-08 NOTE — Telephone Encounter (Signed)
Please call in xanax with 1 refills 

## 2014-03-14 ENCOUNTER — Ambulatory Visit: Payer: Medicaid Other | Admitting: Physical Medicine & Rehabilitation

## 2014-03-14 ENCOUNTER — Encounter: Payer: Medicaid Other | Attending: Physical Medicine & Rehabilitation

## 2014-03-14 DIAGNOSIS — G8929 Other chronic pain: Secondary | ICD-10-CM | POA: Insufficient documentation

## 2014-03-14 DIAGNOSIS — M545 Low back pain, unspecified: Secondary | ICD-10-CM | POA: Insufficient documentation

## 2014-03-14 DIAGNOSIS — F17200 Nicotine dependence, unspecified, uncomplicated: Secondary | ICD-10-CM | POA: Insufficient documentation

## 2014-03-18 ENCOUNTER — Other Ambulatory Visit: Payer: Self-pay | Admitting: Physical Medicine & Rehabilitation

## 2014-03-23 ENCOUNTER — Telehealth: Payer: Self-pay | Admitting: Nurse Practitioner

## 2014-03-24 ENCOUNTER — Other Ambulatory Visit: Payer: Self-pay | Admitting: Nurse Practitioner

## 2014-03-24 MED ORDER — AMPHETAMINE-DEXTROAMPHET ER 30 MG PO CP24: ORAL_CAPSULE | ORAL | Status: DC

## 2014-03-24 NOTE — Telephone Encounter (Signed)
rx ready for pickup 

## 2014-03-24 NOTE — Telephone Encounter (Signed)
Patient aware.

## 2014-03-28 NOTE — Telephone Encounter (Signed)
Last seen 07/23/13  MMM

## 2014-03-28 NOTE — Telephone Encounter (Signed)
Patient NTBS for follow up and lab work  

## 2014-03-31 ENCOUNTER — Other Ambulatory Visit: Payer: Self-pay | Admitting: Nurse Practitioner

## 2014-04-02 NOTE — Telephone Encounter (Signed)
Please call in xanax with 0 refills 

## 2014-04-02 NOTE — Telephone Encounter (Signed)
Last filled 6/16 last ov 06/2013

## 2014-04-02 NOTE — Telephone Encounter (Signed)
rx called into pharmacy

## 2014-04-05 ENCOUNTER — Other Ambulatory Visit: Payer: Self-pay | Admitting: Physical Medicine & Rehabilitation

## 2014-04-05 ENCOUNTER — Other Ambulatory Visit: Payer: Self-pay | Admitting: Nurse Practitioner

## 2014-04-05 NOTE — Telephone Encounter (Signed)
Last OV 10/14

## 2014-04-05 NOTE — Telephone Encounter (Signed)
Patient NTBS for follow up and lab work  

## 2014-04-13 ENCOUNTER — Telehealth: Payer: Self-pay | Admitting: Nurse Practitioner

## 2014-04-13 MED ORDER — TRAMADOL HCL 50 MG PO TABS: ORAL_TABLET | ORAL | Status: DC

## 2014-04-13 NOTE — Telephone Encounter (Signed)
rx ready for pickup 

## 2014-04-14 NOTE — Telephone Encounter (Signed)
Left message that script can be picked up at front desk with photo ID 

## 2014-04-21 ENCOUNTER — Other Ambulatory Visit: Payer: Self-pay | Admitting: Nurse Practitioner

## 2014-04-24 ENCOUNTER — Other Ambulatory Visit: Payer: Self-pay | Admitting: Nurse Practitioner

## 2014-04-25 ENCOUNTER — Telehealth: Payer: Self-pay | Admitting: Nurse Practitioner

## 2014-04-25 MED ORDER — AMPHETAMINE-DEXTROAMPHET ER 30 MG PO CP24: ORAL_CAPSULE | ORAL | Status: DC

## 2014-04-25 NOTE — Telephone Encounter (Signed)
Patient aware.

## 2014-04-25 NOTE — Telephone Encounter (Signed)
rx ready for pickup 

## 2014-04-26 NOTE — Telephone Encounter (Signed)
Pt just received Rx for Tramadol 04/13/14. Please review. Didn't know if you wanted pt on both?

## 2014-05-02 ENCOUNTER — Other Ambulatory Visit: Payer: Self-pay | Admitting: Nurse Practitioner

## 2014-05-03 ENCOUNTER — Other Ambulatory Visit: Payer: Self-pay | Admitting: Nurse Practitioner

## 2014-05-03 NOTE — Telephone Encounter (Signed)
Patient last seen in office on 07-23-13. Rx last filled on 04-15-14 for #[phone]. Please advise. If approved please print and route to Pool B so nurse can call patient to pick up

## 2014-05-03 NOTE — Telephone Encounter (Signed)
No refill without being seen 

## 2014-05-06 ENCOUNTER — Ambulatory Visit (INDEPENDENT_AMBULATORY_CARE_PROVIDER_SITE_OTHER): Payer: Medicaid Other | Admitting: Nurse Practitioner

## 2014-05-06 ENCOUNTER — Encounter: Payer: Self-pay | Admitting: Nurse Practitioner

## 2014-05-06 VITALS — BP 139/93 | HR 89 | Temp 99.4°F | Ht 64.0 in | Wt 149.0 lb

## 2014-05-06 DIAGNOSIS — G8929 Other chronic pain: Secondary | ICD-10-CM

## 2014-05-06 DIAGNOSIS — K219 Gastro-esophageal reflux disease without esophagitis: Secondary | ICD-10-CM

## 2014-05-06 DIAGNOSIS — M545 Low back pain, unspecified: Secondary | ICD-10-CM

## 2014-05-06 DIAGNOSIS — F988 Other specified behavioral and emotional disorders with onset usually occurring in childhood and adolescence: Secondary | ICD-10-CM

## 2014-05-06 MED ORDER — ALPRAZOLAM 1 MG PO TABS: ORAL_TABLET | ORAL | Status: DC

## 2014-05-06 NOTE — Progress Notes (Signed)
Subjective:    Patient ID: Sonia Bailey, female    DOB: 11/02/1985, 28 y.o.   MRN: [id number]  Patient here today for follow up of chronic medical problems.  Gastrophageal Reflux She reports no belching, no chest pain, no coughing, no heartburn or no nausea. This is a chronic problem. The current episode started more than 1 year ago. The problem occurs frequently. The problem has been unchanged. The heartburn duration is an hour. The heartburn is located in the substernum. The heartburn is of moderate intensity. The heartburn wakes her from sleep. The heartburn does not limit her activity. The symptoms are aggravated by smoking and caffeine. Pertinent negatives include no fatigue, muscle weakness or weight loss. Risk factors include hiatal hernia and smoking/tobacco exposure. She has tried a PPI for the symptoms. The treatment provided significant relief. Past procedures include an EGD.  Back Pain This is a chronic problem. The current episode started more than 1 year ago. The problem occurs daily. The problem is unchanged. The pain is present in the lumbar spine. The quality of the pain is described as stabbing and aching. The pain does not radiate. The pain is at a severity of 8/10. The pain is moderate. The pain is the same all the time. The symptoms are aggravated by sitting and position. Stiffness is present in the morning. Pertinent negatives include no bladder incontinence, chest pain, headaches, weakness or weight loss. She has tried heat, NSAIDs, walking, bed rest and ice for the symptoms. The treatment provided mild relief.  Anxiety Onset was 1 to 5 years ago. The problem has been unchanged. Symptoms include nervous/anxious behavior. Patient reports no chest pain, depressed mood, excessive worry, insomnia, irritability, nausea, palpitations or panic. Symptoms occur most days. The severity of symptoms is moderate. The symptoms are aggravated by family issues. The quality of sleep is good.  Nighttime awakenings: several.   There is no history of anxiety/panic attacks or depression. Past treatments include benzodiazephines. The treatment provided significant relief. Compliance with prior treatments has been good.   ADHD This is a chronic problem. Patient has been taking adderall for over 8 years.- Working well at this time.    Review of Systems  Constitutional: Negative.  Negative for weight loss, irritability and fatigue.  Respiratory: Negative.  Negative for cough.   Cardiovascular: Negative.  Negative for chest pain and palpitations.  Gastrointestinal: Negative for heartburn and nausea.  Genitourinary: Negative for bladder incontinence.  Musculoskeletal: Positive for back pain. Negative for muscle weakness.  Neurological: Negative for weakness and headaches.  Psychiatric/Behavioral: The patient is nervous/anxious. The patient does not have insomnia.   All other systems reviewed and are negative. BP 139/93  Pulse 89  Temp(Src) 99.4 F (37.4 C) (Oral)  Ht 5\' 4"  (1.626 m)  Wt 149 lb (67.586 kg)  BMI 25.56 kg/m2  LMP 04/15/2014      Objective:   Physical Exam  Vitals reviewed. Constitutional: She is oriented to person, place, and time. She appears well-developed and well-nourished.  HENT:  Nose: Nose normal.  Mouth/Throat: Oropharynx is clear and moist.  Eyes: EOM are normal.  Neck: Trachea normal, normal range of motion and full passive range of motion without pain. Neck supple. No JVD present. Carotid bruit is not present. No thyromegaly present.  Cardiovascular: Normal rate, regular rhythm, normal heart sounds and intact distal pulses.  Exam reveals no gallop and no friction rub.   No murmur heard. Pulmonary/Chest: Effort normal and breath sounds normal.  Abdominal: Soft. Bowel  sounds are normal. She exhibits no distension and no mass. There is no tenderness.  Musculoskeletal: Normal range of motion.  Lymphadenopathy:    She has no cervical adenopathy.   Neurological: She is alert and oriented to person, place, and time. She has normal reflexes.  Skin: Skin is warm and dry.  Psychiatric: She has a normal mood and affect. Her behavior is normal. Judgment and thought content normal.    BP 139/93  Pulse 89  Temp(Src) 99.4 F (37.4 C) (Oral)  Ht 5\' 4"  (1.626 m)  Wt 149 lb (67.586 kg)  BMI 25.56 kg/m2  LMP 04/15/2014       Assessment & Plan:   1. Gastroesophageal reflux disease without esophagitis   2. 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 ALPRAZolam (XANAX) 1 MG tablet    Sig: TAKE 1 TABLET TWICE A DAY    Dispense:  60 tablet    Refill:  1    Not to exceed 5 additional fills before 09/04/2014    Order Specific Question:  Supervising Provider    Answer:  Ernestina Penna [[id number]]     Patient has been going to pain management and we can no longer fill pain meds - they will dismiss her from their service if she gets from our office. Labs pending Health maintenance reviewed Diet and exercise encouraged Continue all meds Follow up  In 3 months   Mary-Margaret Daphine Deutscher, FNP

## 2014-05-12 ENCOUNTER — Encounter: Payer: Medicaid Other | Attending: Physical Medicine & Rehabilitation

## 2014-05-12 ENCOUNTER — Ambulatory Visit (HOSPITAL_BASED_OUTPATIENT_CLINIC_OR_DEPARTMENT_OTHER): Payer: Medicaid Other | Admitting: Physical Medicine & Rehabilitation

## 2014-05-12 ENCOUNTER — Other Ambulatory Visit: Payer: Self-pay | Admitting: Physical Medicine & Rehabilitation

## 2014-05-12 DIAGNOSIS — G8929 Other chronic pain: Secondary | ICD-10-CM

## 2014-05-12 DIAGNOSIS — IMO0001 Reserved for inherently not codable concepts without codable children: Secondary | ICD-10-CM

## 2014-05-12 DIAGNOSIS — M545 Low back pain, unspecified: Secondary | ICD-10-CM | POA: Diagnosis not present

## 2014-05-12 DIAGNOSIS — S161XXA Strain of muscle, fascia and tendon at neck level, initial encounter: Secondary | ICD-10-CM

## 2014-05-12 DIAGNOSIS — F17200 Nicotine dependence, unspecified, uncomplicated: Secondary | ICD-10-CM | POA: Insufficient documentation

## 2014-05-12 DIAGNOSIS — M549 Dorsalgia, unspecified: Principal | ICD-10-CM

## 2014-05-12 MED ORDER — CYCLOBENZAPRINE HCL 5 MG PO TABS: 5.0000 mg | ORAL_TABLET | Freq: Three times a day (TID) | ORAL | Status: DC | PRN

## 2014-05-12 MED ORDER — MELOXICAM 15 MG PO TABS: 15.0000 mg | ORAL_TABLET | Freq: Every day | ORAL | Status: DC
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
## 2014-05-12 NOTE — Patient Instructions (Signed)
May need to do trigger point injections to R trapezius and levator scapula muscles next visit  Cont chiropractic care

## 2014-05-13 ENCOUNTER — Other Ambulatory Visit: Payer: Self-pay | Admitting: Physical Medicine & Rehabilitation

## 2014-05-17 ENCOUNTER — Telehealth: Payer: Self-pay | Admitting: *Deleted

## 2014-05-17 MED ORDER — TRAMADOL HCL 50 MG PO TABS: ORAL_TABLET | ORAL | Status: DC

## 2014-05-17 NOTE — Telephone Encounter (Signed)
Tramadol called in at [facility]. Left a voicemail informing patient that the medication was called in.

## 2014-05-17 NOTE — Telephone Encounter (Signed)
We can take this over , will need to be seen every 6 months and have opioid agreement signed

## 2014-05-17 NOTE — Telephone Encounter (Signed)
Sonia Bailey called about getting her tramadol refilled.  Are we going to be prescribing this? Please advise.

## 2014-05-23 ENCOUNTER — Other Ambulatory Visit: Payer: Self-pay | Admitting: Nurse Practitioner

## 2014-05-25 ENCOUNTER — Other Ambulatory Visit: Payer: Self-pay | Admitting: Nurse Practitioner

## 2014-05-25 ENCOUNTER — Telehealth: Payer: Self-pay | Admitting: Nurse Practitioner

## 2014-05-26 MED ORDER — AMPHETAMINE-DEXTROAMPHET ER 30 MG PO CP24: ORAL_CAPSULE | ORAL | Status: DC

## 2014-05-26 NOTE — Telephone Encounter (Signed)
rx ready for pickup 

## 2014-05-26 NOTE — Telephone Encounter (Signed)
Pt notified that script can be picked up at our front desk with photo ID

## 2014-05-26 NOTE — Telephone Encounter (Signed)
Last ov 8/15. 

## 2014-05-26 NOTE — Telephone Encounter (Signed)
Since patient has been dismissed she will need ot come in and pick up rx.

## 2014-06-15 ENCOUNTER — Telehealth: Payer: Self-pay | Admitting: *Deleted

## 2014-06-15 NOTE — Telephone Encounter (Signed)
Injection one month ago.  Tramadol is not working and pain worse.  Can medication be increased or changed?

## 2014-06-16 NOTE — Telephone Encounter (Signed)
May change tramadol to QID

## 2014-06-17 NOTE — Telephone Encounter (Signed)
Attempted to notify pt.  Home number not in service and no name on voicemail of mobile number

## 2014-06-20 ENCOUNTER — Ambulatory Visit (HOSPITAL_BASED_OUTPATIENT_CLINIC_OR_DEPARTMENT_OTHER): Payer: Medicaid Other | Admitting: Physical Medicine & Rehabilitation

## 2014-06-20 ENCOUNTER — Encounter: Payer: Medicaid Other | Attending: Physical Medicine & Rehabilitation

## 2014-06-20 ENCOUNTER — Encounter: Payer: Self-pay | Admitting: Physical Medicine & Rehabilitation

## 2014-06-20 VITALS — BP 136/86 | HR 97 | Resp 14 | Ht 64.0 in | Wt 146.0 lb

## 2014-06-20 DIAGNOSIS — F17200 Nicotine dependence, unspecified, uncomplicated: Secondary | ICD-10-CM | POA: Insufficient documentation

## 2014-06-20 DIAGNOSIS — M545 Low back pain, unspecified: Secondary | ICD-10-CM | POA: Insufficient documentation

## 2014-06-20 DIAGNOSIS — G8929 Other chronic pain: Secondary | ICD-10-CM

## 2014-06-20 DIAGNOSIS — IMO0001 Reserved for inherently not codable concepts without codable children: Secondary | ICD-10-CM

## 2014-06-20 MED ORDER — TRAMADOL HCL 50 MG PO TABS: ORAL_TABLET | ORAL | Status: DC

## 2014-06-20 NOTE — Telephone Encounter (Signed)
Austynn came in to see Dr Wynn Banker today.

## 2014-06-20 NOTE — Patient Instructions (Signed)
Resume Chiropracter treatments  Cont back exercises
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
## 2014-06-22 ENCOUNTER — Telehealth: Payer: Self-pay | Admitting: Nurse Practitioner

## 2014-06-23 ENCOUNTER — Other Ambulatory Visit: Payer: Self-pay | Admitting: Nurse Practitioner

## 2014-06-27 MED ORDER — AMPHETAMINE-DEXTROAMPHET ER 30 MG PO CP24: ORAL_CAPSULE | ORAL | Status: DC

## 2014-06-27 NOTE — Telephone Encounter (Signed)
Patient aware to pick up 

## 2014-06-27 NOTE — Telephone Encounter (Signed)
rx ready for pickup 

## 2014-07-07 ENCOUNTER — Other Ambulatory Visit: Payer: Self-pay | Admitting: Physical Medicine & Rehabilitation

## 2014-07-07 ENCOUNTER — Other Ambulatory Visit: Payer: Self-pay | Admitting: Nurse Practitioner

## 2014-07-08 ENCOUNTER — Other Ambulatory Visit: Payer: Self-pay | Admitting: Physical Medicine & Rehabilitation

## 2014-07-08 ENCOUNTER — Other Ambulatory Visit: Payer: Self-pay | Admitting: Nurse Practitioner

## 2014-07-09 NOTE — Telephone Encounter (Signed)
Patient last seen in office on 05-06-14. Rx last filled on 06-09-14 for #60. Please advise

## 2014-07-10 NOTE — Telephone Encounter (Signed)
Please call in xanax with 0 refills 

## 2014-07-11 NOTE — Telephone Encounter (Signed)
Called in with no refills.  

## 2014-07-12 ENCOUNTER — Ambulatory Visit: Payer: Medicaid Other | Admitting: Physical Medicine & Rehabilitation

## 2014-07-19 ENCOUNTER — Telehealth: Payer: Self-pay | Admitting: Nurse Practitioner

## 2014-07-19 MED ORDER — AMPHETAMINE-DEXTROAMPHET ER 30 MG PO CP24: ORAL_CAPSULE | ORAL | Status: DC

## 2014-07-19 NOTE — Telephone Encounter (Signed)
rx ready for pickup 

## 2014-07-20 NOTE — Telephone Encounter (Signed)
Patient aware.

## 2014-07-30 ENCOUNTER — Other Ambulatory Visit: Payer: Self-pay | Admitting: Nurse Practitioner

## 2014-07-30 ENCOUNTER — Other Ambulatory Visit: Payer: Self-pay | Admitting: Physical Medicine & Rehabilitation

## 2014-08-01 ENCOUNTER — Other Ambulatory Visit: Payer: Self-pay | Admitting: *Deleted

## 2014-08-01 NOTE — Telephone Encounter (Signed)
Xanax called in 

## 2014-08-01 NOTE — Telephone Encounter (Signed)
Please call in xanax with 0 refills 

## 2014-08-03 ENCOUNTER — Other Ambulatory Visit: Payer: Self-pay | Admitting: Nurse Practitioner

## 2014-08-08 ENCOUNTER — Other Ambulatory Visit: Payer: Self-pay | Admitting: Physical Medicine & Rehabilitation

## 2014-08-22 ENCOUNTER — Telehealth: Payer: Self-pay | Admitting: *Deleted

## 2014-08-22 ENCOUNTER — Telehealth: Payer: Self-pay | Admitting: Nurse Practitioner

## 2014-08-22 MED ORDER — AMPHETAMINE-DEXTROAMPHET ER 30 MG PO CP24: ORAL_CAPSULE | ORAL | Status: AC

## 2014-08-22 NOTE — Telephone Encounter (Signed)
adderall rx ready for pick up no more refills without being seen  

## 2014-08-22 NOTE — Telephone Encounter (Signed)
Aware,script ready and will need an office visit for more refills.

## 2014-08-26 ENCOUNTER — Other Ambulatory Visit: Payer: Self-pay | Admitting: Physical Medicine & Rehabilitation

## 2014-08-26 ENCOUNTER — Other Ambulatory Visit: Payer: Self-pay | Admitting: Nurse Practitioner

## 2014-09-12 ENCOUNTER — Ambulatory Visit (HOSPITAL_BASED_OUTPATIENT_CLINIC_OR_DEPARTMENT_OTHER): Payer: Medicaid Other | Admitting: Physical Medicine & Rehabilitation

## 2014-09-12 ENCOUNTER — Encounter: Payer: Medicaid Other | Attending: Physical Medicine & Rehabilitation

## 2014-09-12 ENCOUNTER — Encounter: Payer: Self-pay | Admitting: Physical Medicine & Rehabilitation

## 2014-09-12 VITALS — BP 140/80 | HR 126 | Resp 14

## 2014-09-12 DIAGNOSIS — IMO0001 Reserved for inherently not codable concepts without codable children: Secondary | ICD-10-CM

## 2014-09-12 DIAGNOSIS — M609 Myositis, unspecified: Secondary | ICD-10-CM

## 2014-09-12 DIAGNOSIS — M791 Myalgia: Secondary | ICD-10-CM | POA: Insufficient documentation

## 2014-09-12 MED ORDER — TRAMADOL HCL 50 MG PO TABS: ORAL_TABLET | ORAL | Status: DC
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
## 2014-09-12 NOTE — Patient Instructions (Signed)
Trigger Point Injection Trigger points are areas where you have muscle pain. A trigger point injection is a shot given in the trigger point to relieve that pain. A trigger point might feel like a knot in your muscle. It hurts to press on a trigger point. Sometimes the pain spreads out (radiates) to other parts of the body. For example, pressing on a trigger point in your shoulder might cause pain in your arm or neck. You might have one trigger point. Or, you might have more than one. People often have trigger points in their upper back and lower back. They also occur often in the neck and shoulders. Pain from a trigger point lasts for a long time. It can make it hard to keep moving. You might not be able to do the exercise or physical therapy that could help you deal with the pain. A trigger point injection may help. It does not work for everyone. But, it may relieve your pain for a few days or a few months. A trigger point injection does not cure long-lasting (chronic) pain. LET YOUR CAREGIVER KNOW ABOUT:  Any allergies (especially to latex, lidocaine, or steroids).  Blood-thinning medicines that you take. These drugs can lead to bleeding or bruising after an injection. They include:  Aspirin.  Ibuprofen.  Clopidogrel.  Warfarin.  Other medicines you take. This includes all vitamins, herbs, eyedrops, over-the-counter medicines, and creams.  Use of steroids.  Recent infections.  Past problems with numbing medicines.  Bleeding problems.  Surgeries you have had.  Other health problems. RISKS AND COMPLICATIONS A trigger point injection is a safe treatment. However, problems may develop, such as:  Minor side effects usually go away in 1 to 2 days. These may include:  Soreness.  Bruising.  Stiffness.  More serious problems are rare. But, they may include:  Bleeding under the skin (hematoma).  Skin infection.  Breaking off of the needle under your skin.  Lung  puncture.  The trigger point injection may not work for you. BEFORE THE PROCEDURE You may need to stop taking any medicine that thins your blood. This is to prevent bleeding and bruising. Usually these medicines are stopped several days before the injection. No other preparation is needed. PROCEDURE  A trigger point injection can be given in your caregiver's office or in a clinic. Each injection takes 2 minutes or less.  Your caregiver will feel for trigger points. The caregiver may use a marker to circle the area for the injection.  The skin over the trigger point will be washed with a germ-killing (antiseptic) solution.  The caregiver pinches the spot for the injection.  Then, a very thin needle is used for the shot. You may feel pain or a twitching feeling when the needle enters the trigger point.  A numbing solution may be injected into the trigger point. Sometimes a drug to keep down swelling, redness, and warmth (inflammation) is also injected.  Your caregiver moves the needle around the trigger zone until the tightness and twitching goes away.  After the injection, your caregiver may put gentle pressure over the injection site.  Then it is covered with a bandage. AFTER THE PROCEDURE  You can go right home after the injection.  The bandage can be taken off after a few hours.  You may feel sore and stiff for 1 to 2 days.  Go back to your regular activities slowly. Your caregiver may ask you to stretch your muscles. Do not do anything that takes   extra energy for a few days.  Follow your caregiver's instructions to manage and treat other pain. Document Released: 08/29/2011 Document Revised: 01/04/2013 Document Reviewed: 08/29/2011 Exit[employer] Patient Information 2015 [employer]. This information is not intended to replace advice given to you by your health care provider. Make sure you discuss any questions you have with your health care provider.  

## 2014-09-19 ENCOUNTER — Ambulatory Visit: Payer: Medicaid Other | Admitting: Physical Medicine & Rehabilitation

## 2014-09-23 ENCOUNTER — Other Ambulatory Visit: Payer: Self-pay | Admitting: Nurse Practitioner

## 2014-10-04 ENCOUNTER — Other Ambulatory Visit: Payer: Self-pay | Admitting: Physical Medicine & Rehabilitation

## 2014-10-13 ENCOUNTER — Other Ambulatory Visit: Payer: Self-pay | Admitting: Nurse Practitioner

## 2014-10-15 NOTE — Telephone Encounter (Signed)
Last seen 08/15

## 2014-10-17 NOTE — Telephone Encounter (Signed)
rx called into pharmacy

## 2014-10-17 NOTE — Telephone Encounter (Signed)
no more refills without being seen  

## 2014-10-22 ENCOUNTER — Other Ambulatory Visit: Payer: Self-pay | Admitting: Physical Medicine & Rehabilitation

## 2014-10-24 ENCOUNTER — Other Ambulatory Visit: Payer: Self-pay | Admitting: Physical Medicine & Rehabilitation

## 2014-11-14 ENCOUNTER — Encounter: Payer: Medicaid Other | Attending: Physical Medicine & Rehabilitation

## 2014-11-14 ENCOUNTER — Ambulatory Visit (HOSPITAL_BASED_OUTPATIENT_CLINIC_OR_DEPARTMENT_OTHER): Payer: Medicaid Other | Admitting: Physical Medicine & Rehabilitation

## 2014-11-14 ENCOUNTER — Encounter: Payer: Self-pay | Admitting: Physical Medicine & Rehabilitation

## 2014-11-14 VITALS — BP 138/74 | HR 90 | Resp 14

## 2014-11-14 DIAGNOSIS — M609 Myositis, unspecified: Secondary | ICD-10-CM | POA: Insufficient documentation

## 2014-11-14 DIAGNOSIS — G8929 Other chronic pain: Secondary | ICD-10-CM

## 2014-11-14 DIAGNOSIS — M791 Myalgia: Secondary | ICD-10-CM | POA: Insufficient documentation

## 2014-11-14 DIAGNOSIS — M549 Dorsalgia, unspecified: Secondary | ICD-10-CM

## 2014-11-14 DIAGNOSIS — IMO0001 Reserved for inherently not codable concepts without codable children: Secondary | ICD-10-CM

## 2014-11-14 MED ORDER — MELOXICAM 15 MG PO TABS: ORAL_TABLET | ORAL | Status: DC

## 2014-11-14 NOTE — Patient Instructions (Signed)
Trigger Point Injection Trigger points are areas where you have muscle pain. A trigger point injection is a shot given in the trigger point to relieve that pain. A trigger point might feel like a knot in your muscle. It hurts to press on a trigger point. Sometimes the pain spreads out (radiates) to other parts of the body. For example, pressing on a trigger point in your shoulder might cause pain in your arm or neck. You might have one trigger point. Or, you might have more than one. People often have trigger points in their upper back and lower back. They also occur often in the neck and shoulders. Pain from a trigger point lasts for a long time. It can make it hard to keep moving. You might not be able to do the exercise or physical therapy that could help you deal with the pain. A trigger point injection may help. It does not work for everyone. But, it may relieve your pain for a few days or a few months. A trigger point injection does not cure long-lasting (chronic) pain. LET YOUR CAREGIVER KNOW ABOUT:  Any allergies (especially to latex, lidocaine, or steroids).  Blood-thinning medicines that you take. These drugs can lead to bleeding or bruising after an injection. They include:  Aspirin.  Ibuprofen.  Clopidogrel.  Warfarin.  Other medicines you take. This includes all vitamins, herbs, eyedrops, over-the-counter medicines, and creams.  Use of steroids.  Recent infections.  Past problems with numbing medicines.  Bleeding problems.  Surgeries you have had.  Other health problems. RISKS AND COMPLICATIONS A trigger point injection is a safe treatment. However, problems may develop, such as:  Minor side effects usually go away in 1 to 2 days. These may include:  Soreness.  Bruising.  Stiffness.  More serious problems are rare. But, they may include:  Bleeding under the skin (hematoma).  Skin infection.  Breaking off of the needle under your skin.  Lung  puncture.  The trigger point injection may not work for you. BEFORE THE PROCEDURE You may need to stop taking any medicine that thins your blood. This is to prevent bleeding and bruising. Usually these medicines are stopped several days before the injection. No other preparation is needed. PROCEDURE  A trigger point injection can be given in your caregiver's office or in a clinic. Each injection takes 2 minutes or less.  Your caregiver will feel for trigger points. The caregiver may use a marker to circle the area for the injection.  The skin over the trigger point will be washed with a germ-killing (antiseptic) solution.  The caregiver pinches the spot for the injection.  Then, a very thin needle is used for the shot. You may feel pain or a twitching feeling when the needle enters the trigger point.  A numbing solution may be injected into the trigger point. Sometimes a drug to keep down swelling, redness, and warmth (inflammation) is also injected.  Your caregiver moves the needle around the trigger zone until the tightness and twitching goes away.  After the injection, your caregiver may put gentle pressure over the injection site.  Then it is covered with a bandage. AFTER THE PROCEDURE  You can go right home after the injection.  The bandage can be taken off after a few hours.  You may feel sore and stiff for 1 to 2 days.  Go back to your regular activities slowly. Your caregiver may ask you to stretch your muscles. Do not do anything that takes   extra energy for a few days.  Follow your caregiver's instructions to manage and treat other pain. Document Released: 08/29/2011 Document Revised: 01/04/2013 Document Reviewed: 08/29/2011 Exit[employer] Patient Information 2015 [employer]. This information is not intended to replace advice given to you by your health care provider. Make sure you discuss any questions you have with your health care provider.  
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
## 2014-11-15 ENCOUNTER — Other Ambulatory Visit: Payer: Self-pay | Admitting: Nurse Practitioner

## 2014-11-15 NOTE — Telephone Encounter (Signed)
Last seen 05/06/14 MMM This med not on EPIC list

## 2014-11-28 ENCOUNTER — Other Ambulatory Visit: Payer: Self-pay | Admitting: Physical Medicine & Rehabilitation

## 2014-11-29 ENCOUNTER — Telehealth: Payer: Self-pay | Admitting: *Deleted

## 2014-11-29 MED ORDER — TRAMADOL HCL 50 MG PO TABS: ORAL_TABLET | ORAL | Status: DC

## 2014-11-29 NOTE — Telephone Encounter (Signed)
Sonia Bailey called because she says the pharmacy does not have refills on her tramadol (they had requested refill electronically but I initially rejected it because of the printed rx given at December 2015 visit.) I have called a refill to the pharmacy for the remainder of the rx (she should have refills through May)so I refilled today with 2 additional refills. I notified Sonia Bailey.

## 2014-12-17 ENCOUNTER — Other Ambulatory Visit: Payer: Self-pay | Admitting: Nurse Practitioner

## 2014-12-19 NOTE — Telephone Encounter (Signed)
Last seen 05/06/14 MMM 

## 2014-12-30 ENCOUNTER — Other Ambulatory Visit: Payer: Self-pay | Admitting: Physical Medicine & Rehabilitation

## 2015-01-06 ENCOUNTER — Other Ambulatory Visit: Payer: Self-pay | Admitting: Physical Medicine & Rehabilitation

## 2015-01-09 ENCOUNTER — Other Ambulatory Visit: Payer: Self-pay | Admitting: *Deleted

## 2015-01-09 ENCOUNTER — Other Ambulatory Visit: Payer: Self-pay | Admitting: Physical Medicine & Rehabilitation

## 2015-01-09 NOTE — Telephone Encounter (Signed)
Recd electronic refill request for Tramadol 50 mg tablets - instructions take 1-2 tablets up to 4 times daily as needed for pain #120  #RF 2. Called in

## 2015-01-10 ENCOUNTER — Telehealth: Payer: Self-pay | Admitting: *Deleted

## 2015-01-10 NOTE — Telephone Encounter (Signed)
Sonia Bailey called because [facility] was telling her that she had no refills on her tramadol and she had been waiting a week.  I explained that it was called  in on 11/29/14 with 2 additional refills.  I called the pharmacy and they said they had the medication for her (uncertain why they kept telling her they did not have any refills).  It has beenstraightened out now and Sonia Bailey is aware..Marland Kitchen

## 2015-01-16 ENCOUNTER — Encounter: Payer: Medicaid Other | Attending: Physical Medicine & Rehabilitation

## 2015-01-16 ENCOUNTER — Encounter: Payer: Self-pay | Admitting: Physical Medicine & Rehabilitation

## 2015-01-16 ENCOUNTER — Ambulatory Visit (HOSPITAL_BASED_OUTPATIENT_CLINIC_OR_DEPARTMENT_OTHER): Payer: Medicaid Other | Admitting: Physical Medicine & Rehabilitation

## 2015-01-16 VITALS — HR 114 | Resp 14

## 2015-01-16 DIAGNOSIS — M7918 Myalgia, other site: Secondary | ICD-10-CM

## 2015-01-16 DIAGNOSIS — M609 Myositis, unspecified: Secondary | ICD-10-CM | POA: Diagnosis not present

## 2015-01-16 DIAGNOSIS — M791 Myalgia: Secondary | ICD-10-CM | POA: Diagnosis not present

## 2015-01-16 DIAGNOSIS — M7062 Trochanteric bursitis, left hip: Secondary | ICD-10-CM | POA: Diagnosis not present

## 2015-01-16 DIAGNOSIS — M7061 Trochanteric bursitis, right hip: Secondary | ICD-10-CM

## 2015-01-16 NOTE — Patient Instructions (Signed)
Hip Exercises RANGE OF MOTION (ROM) AND STRETCHING EXERCISES  These exercises may help you when beginning to rehabilitate your injury. Doing them too aggressively can worsen your condition. Complete them slowly and gently. Your symptoms may resolve with or without further involvement from your physician, physical therapist or athletic trainer. While completing these exercises, remember:   Restoring tissue flexibility helps normal motion to return to the joints. This allows healthier, less painful movement and activity.  An effective stretch should be held for at least 30 seconds.  A stretch should never be painful. You should only feel a gentle lengthening or release in the stretched tissue. If these stretches worsen your symptoms even when done gently, consult your physician, physical therapist or athletic trainer. STRETCH - Hamstrings, Supine   Lie on your back. Loop a belt or towel over the ball of your right / left foot.  Straighten your right / left knee and slowly pull on the belt to raise your leg. Do not allow the right / left knee to bend. Keep your opposite leg flat on the floor.  Raise the leg until you feel a gentle stretch behind your right / left knee or thigh. Hold this position for __________ seconds. Repeat __________ times. Complete this stretch __________ times per day.  STRETCH - Hip Rotators   Lie on your back on a firm surface. Grasp your right / left knee with your right / left hand and your ankle with your opposite hand.  Keeping your hips and shoulders firmly planted, gently pull your right / left knee and rotate your lower leg toward your opposite shoulder until you feel a stretch in your buttocks.  Hold this stretch for __________ seconds. Repeat this stretch __________ times. Complete this stretch __________ times per day. STRETCH - Hamstrings/Adductors, V-Sit   Sit on the floor with your legs extended in a large "V," keeping your knees straight.  With your  head and chest upright, bend at your waist reaching for your right foot to stretch your left adductors.  You should feel a stretch in your left inner thigh. Hold for __________ seconds.  Return to the upright position to relax your leg muscles.  Continuing to keep your chest upright, bend straight forward at your waist to stretch your hamstrings.  You should feel a stretch behind both of your thighs and/or knees. Hold for __________ seconds.  Return to the upright position to relax your leg muscles.  Repeat steps 2 through 4 for opposite leg. Repeat __________ times. Complete this exercise __________ times per day.  STRETCHING - Hip Flexors, Lunge  Half kneel with your right / left knee on the floor and your opposite knee bent and directly over your ankle.  Keep good posture with your head over your shoulders. Tighten your buttocks to point your tailbone downward; this will prevent your back from arching too much.  You should feel a gentle stretch in the front of your thigh and/or hip. If you do not feel any resistance, slightly slide your opposite foot forward and then slowly lunge forward so your knee once again lines up over your ankle. Be sure your tailbone remains pointed downward.  Hold this stretch for __________ seconds. Repeat __________ times. Complete this stretch __________ times per day. STRENGTHENING EXERCISES These exercises may help you when beginning to rehabilitate your injury. They may resolve your symptoms with or without further involvement from your physician, physical therapist or athletic trainer. While completing these exercises, remember:   Muscles   can gain both the endurance and the strength needed for everyday activities through controlled exercises.  Complete these exercises as instructed by your physician, physical therapist or athletic trainer. Progress the resistance and repetitions only as guided.  You may experience muscle soreness or fatigue, but the  pain or discomfort you are trying to eliminate should never worsen during these exercises. If this pain does worsen, stop and make certain you are following the directions exactly. If the pain is still present after adjustments, discontinue the exercise until you can discuss the trouble with your clinician. STRENGTH - Hip Extensors, Bridge   Lie on your back on a firm surface. Bend your knees and place your feet flat on the floor.  Tighten your buttocks muscles and lift your bottom off the floor until your trunk is level with your thighs. You should feel the muscles in your buttocks and back of your thighs working. If you do not feel these muscles, slide your feet 1-2 inches further away from your buttocks.  Hold this position for __________ seconds.  Slowly lower your hips to the starting position and allow your buttock muscles relax completely before beginning the next repetition.  If this exercise is too easy, you may cross your arms over your chest. Repeat __________ times. Complete this exercise __________ times per day.  STRENGTH - Hip Abductors, Straight Leg Raises  Be aware of your form throughout the entire exercise so that you exercise the correct muscles. Sloppy form means that you are not strengthening the correct muscles.  Lie on your side so that your head, shoulders, knee and hip line up. You may bend your lower knee to help maintain your balance. Your right / left leg should be on top.  Roll your hips slightly forward, so that your hips are stacked directly over each other and your right / left knee is facing forward.  Lift your top leg up 4-6 inches, leading with your heel. Be sure that your foot does not drift forward or that your knee does not roll toward the ceiling.  Hold this position for __________ seconds. You should feel the muscles in your outer hip lifting (you may not notice this until your leg begins to tire).  Slowly lower your leg to the starting position. Allow  the muscles to fully relax before beginning the next repetition. Repeat __________ times. Complete this exercise __________ times per day.  STRENGTH - Hip Adductors, Straight Leg Raises   Lie on your side so that your head, shoulders, knee and hip line up. You may place your upper foot in front to help maintain your balance. Your right / left leg should be on the bottom.  Roll your hips slightly forward, so that your hips are stacked directly over each other and your right / left knee is facing forward.  Tense the muscles in your inner thigh and lift your bottom leg 4-6 inches. Hold this position for __________ seconds.  Slowly lower your leg to the starting position. Allow the muscles to fully relax before beginning the next repetition. Repeat __________ times. Complete this exercise __________ times per day.  STRENGTH - Quadriceps, Straight Leg Raises  Quality counts! Watch for signs that the quadriceps muscle is working to insure you are strengthening the correct muscles and not "cheating" by substituting with healthier muscles.  Lay on your back with your right / left leg extended and your opposite knee bent.  Tense the muscles in the front of your right / left   thigh. You should see either your knee cap slide up or increased dimpling just above the knee. Your thigh may even quiver.  Tighten these muscles even more and raise your leg 4 to 6 inches off the floor. Hold for right / left seconds.  Keeping these muscles tense, lower your leg.  Relax the muscles slowly and completely in between each repetition. Repeat __________ times. Complete this exercise __________ times per day.  STRENGTH - Hip Abductors, Standing  Tie one end of a rubber exercise band/tubing to a secure surface (table, pole) and tie a loop at the other end.  Place the loop around your right / left ankle. Keeping your ankle with the band directly opposite of the secured end, step away until there is tension in the  tube/band.  Hold onto a chair as needed for balance.  Keeping your back upright, your shoulders over your hips, and your toes pointing forward, lift your right / left leg out to your side. Be sure to lift your leg with your hip muscles. Do not "throw" your leg or tip your body to lift your leg.  Slowly and with control, return to the starting position. Repeat exercise __________ times. Complete this exercise __________ times per day.  STRENGTH - Quadriceps, Squats  Stand in a door frame so that your feet and knees are in line with the frame.  Use your hands for balance, not support, on the frame.  Slowly lower your weight, bending at the hips and knees. Keep your lower legs upright so that they are parallel with the door frame. Squat only within the range that does not increase your knee pain. Never let your hips drop below your knees.  Slowly return upright, pushing with your legs, not pulling with your hands. Document Released: 09/27/2005 Document Revised: 12/02/2011 Document Reviewed: 12/22/2008 ExitCare Patient Information 2015 [employer]. This information is not intended to replace advice given to you by your health care provider. Make sure you discuss any questions you have with your health care provider.  
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
## 2015-02-14 ENCOUNTER — Ambulatory Visit: Payer: Medicaid Other | Admitting: Physical Medicine & Rehabilitation

## 2015-02-16 ENCOUNTER — Ambulatory Visit (HOSPITAL_BASED_OUTPATIENT_CLINIC_OR_DEPARTMENT_OTHER): Payer: Medicaid Other | Admitting: Physical Medicine & Rehabilitation

## 2015-02-16 ENCOUNTER — Encounter: Payer: Medicaid Other | Attending: Physical Medicine & Rehabilitation

## 2015-02-16 ENCOUNTER — Encounter: Payer: Self-pay | Admitting: Physical Medicine & Rehabilitation

## 2015-02-16 VITALS — BP 117/75 | HR 117 | Resp 14

## 2015-02-16 DIAGNOSIS — M791 Myalgia: Secondary | ICD-10-CM | POA: Diagnosis not present

## 2015-02-16 DIAGNOSIS — M609 Myositis, unspecified: Secondary | ICD-10-CM | POA: Diagnosis not present

## 2015-02-16 DIAGNOSIS — M7061 Trochanteric bursitis, right hip: Secondary | ICD-10-CM | POA: Insufficient documentation

## 2015-02-16 DIAGNOSIS — M7062 Trochanteric bursitis, left hip: Secondary | ICD-10-CM

## 2015-02-16 MED ORDER — TRAMADOL HCL 50 MG PO TABS: ORAL_TABLET | ORAL | Status: DC

## 2015-02-16 NOTE — Patient Instructions (Signed)
Joint Injection  Care After  Refer to this sheet in the next few days. These instructions provide you with information on caring for yourself after you have had a joint injection. Your caregiver also may give you more specific instructions. Your treatment has been planned according to current medical practices, but problems sometimes occur. Call your caregiver if you have any problems or questions after your procedure.  After any type of joint injection, it is not uncommon to experience:  · Soreness, swelling, or bruising around the injection site.  · Mild numbness, tingling, or weakness around the injection site caused by the numbing medicine used before or with the injection.  It also is possible to experience the following effects associated with the specific agent after injection:  · Iodine-based contrast agents:  ¨ Allergic reaction (itching, hives, widespread redness, and swelling beyond the injection site).  · Corticosteroids (These effects are rare.):  ¨ Allergic reaction.  ¨ Increased blood sugar levels (If you have diabetes and you notice that your blood sugar levels have increased, notify your caregiver).  ¨ Increased blood pressure levels.  ¨ Mood swings.  · Hyaluronic acid in the use of viscosupplementation.  ¨ Temporary heat or redness.  ¨ Temporary rash and itching.  ¨ Increased fluid accumulation in the injected joint.  These effects all should resolve within a day after your procedure.   HOME CARE INSTRUCTIONS  · Limit yourself to light activity the day of your procedure. Avoid lifting heavy objects, bending, stooping, or twisting.  · Take prescription or over-the-counter pain medication as directed by your caregiver.  · You may apply ice to your injection site to reduce pain and swelling the day of your procedure. Ice may be applied 03-04 times:  ¨ Put ice in a plastic bag.  ¨ Place a towel between your skin and the bag.  ¨ Leave the ice on for no longer than 15-20 minutes each time.  SEEK  IMMEDIATE MEDICAL CARE IF:   · Pain and swelling get worse rather than better or extend beyond the injection site.  · Numbness does not go away.  · Blood or fluid continues to leak from the injection site.  · You have chest pain.  · You have swelling of your face or tongue.  · You have trouble breathing or you become dizzy.  · You develop a fever, chills, or severe tenderness at the injection site that last longer than 1 day.  MAKE SURE YOU:  · Understand these instructions.  · Watch your condition.  · Get help right away if you are not doing well or if you get worse.  Document Released: 05/23/2011 Document Revised: 12/02/2011 Document Reviewed: 05/23/2011  Exit[employer]® Patient Information ©2015 [employer]. This information is not intended to replace advice given to you by your health care provider. Make sure you discuss any questions you have with your health care provider.

## 2015-02-16 NOTE — Progress Notes (Signed)
Bilateral Trochanteric bursa injection With  ultrasound guidance  Indication Trochanteric bursitis. Exam has tenderness over the greater trochanter of the hip.Posterior aspect Pain has not responded to conservative care such as exercise therapy and oral medications. Pain interferes with sleep or with mobility Informed consent was obtained after describing risks and benefits of the procedure with the patient these include bleeding bruising and infection. Patient has signed written consent form. Patient placed in a lateral decubitus position with the affected hip superior. Point of maximal pain was palpated marked and prepped with Betadine and entered with a needle to bone contact. Needle slightly withdrawn then 6mg  of betamethasone with 4 cc 1% lidocaine were injected. Patient tolerated procedure well. Post procedure instructions given.

## 2015-02-26 ENCOUNTER — Other Ambulatory Visit: Payer: Self-pay | Admitting: Physical Medicine & Rehabilitation

## 2015-03-16 ENCOUNTER — Ambulatory Visit: Payer: Medicaid Other | Admitting: Physical Medicine & Rehabilitation

## 2015-03-20 ENCOUNTER — Encounter: Payer: Self-pay | Admitting: Physical Medicine & Rehabilitation

## 2015-03-20 ENCOUNTER — Encounter: Payer: Medicaid Other | Attending: Physical Medicine & Rehabilitation

## 2015-03-20 ENCOUNTER — Ambulatory Visit (HOSPITAL_BASED_OUTPATIENT_CLINIC_OR_DEPARTMENT_OTHER): Payer: Medicaid Other | Admitting: Physical Medicine & Rehabilitation

## 2015-03-20 VITALS — BP 107/69 | HR 104 | Resp 14

## 2015-03-20 DIAGNOSIS — M609 Myositis, unspecified: Secondary | ICD-10-CM | POA: Insufficient documentation

## 2015-03-20 DIAGNOSIS — M791 Myalgia: Secondary | ICD-10-CM | POA: Diagnosis not present

## 2015-03-20 DIAGNOSIS — IMO0001 Reserved for inherently not codable concepts without codable children: Secondary | ICD-10-CM

## 2015-03-20 NOTE — Progress Notes (Signed)
Trigger Point Injection  Indication: Lumbar paraspinal Myofascial pain not relieved by medication management and other conservative care.  Informed consent was obtained after describing risk and benefits of the procedure with the patient, this includes bleeding, bruising, infection and medication side effects.  The patient wishes to proceed and has given written consent.  The patient was placed in a prone position.  The lumbar paraspinal bilateral L2,3,4,5 area was marked and prepped with Betadine.  It was entered with a 25-gauge 1-1/2 inch needle and 1 mL of 1% lidocaine was injected into each of 8 trigger points, after negative draw back for blood.  The patient tolerated the procedure well.  Post procedure instructions were given.

## 2015-03-20 NOTE — Patient Instructions (Signed)
Trigger Point Injection Trigger points are areas where you have muscle pain. A trigger point injection is a shot given in the trigger point to relieve that pain. A trigger point might feel like a knot in your muscle. It hurts to press on a trigger point. Sometimes the pain spreads out (radiates) to other parts of the body. For example, pressing on a trigger point in your shoulder might cause pain in your arm or neck. You might have one trigger point. Or, you might have more than one. People often have trigger points in their upper back and lower back. They also occur often in the neck and shoulders. Pain from a trigger point lasts for a long time. It can make it hard to keep moving. You might not be able to do the exercise or physical therapy that could help you deal with the pain. A trigger point injection may help. It does not work for everyone. But, it may relieve your pain for a few days or a few months. A trigger point injection does not cure long-lasting (chronic) pain. LET YOUR CAREGIVER KNOW ABOUT:  Any allergies (especially to latex, lidocaine, or steroids).  Blood-thinning medicines that you take. These drugs can lead to bleeding or bruising after an injection. They include:  Aspirin.  Ibuprofen.  Clopidogrel.  Warfarin.  Other medicines you take. This includes all vitamins, herbs, eyedrops, over-the-counter medicines, and creams.  Use of steroids.  Recent infections.  Past problems with numbing medicines.  Bleeding problems.  Surgeries you have had.  Other health problems. RISKS AND COMPLICATIONS A trigger point injection is a safe treatment. However, problems may develop, such as:  Minor side effects usually go away in 1 to 2 days. These may include:  Soreness.  Bruising.  Stiffness.  More serious problems are rare. But, they may include:  Bleeding under the skin (hematoma).  Skin infection.  Breaking off of the needle under your skin.  Lung  puncture.  The trigger point injection may not work for you. BEFORE THE PROCEDURE You may need to stop taking any medicine that thins your blood. This is to prevent bleeding and bruising. Usually these medicines are stopped several days before the injection. No other preparation is needed. PROCEDURE  A trigger point injection can be given in your caregiver's office or in a clinic. Each injection takes 2 minutes or less.  Your caregiver will feel for trigger points. The caregiver may use a marker to circle the area for the injection.  The skin over the trigger point will be washed with a germ-killing (antiseptic) solution.  The caregiver pinches the spot for the injection.  Then, a very thin needle is used for the shot. You may feel pain or a twitching feeling when the needle enters the trigger point.  A numbing solution may be injected into the trigger point. Sometimes a drug to keep down swelling, redness, and warmth (inflammation) is also injected.  Your caregiver moves the needle around the trigger zone until the tightness and twitching goes away.  After the injection, your caregiver may put gentle pressure over the injection site.  Then it is covered with a bandage. AFTER THE PROCEDURE  You can go right home after the injection.  The bandage can be taken off after a few hours.  You may feel sore and stiff for 1 to 2 days.  Go back to your regular activities slowly. Your caregiver may ask you to stretch your muscles. Do not do anything that takes   extra energy for a few days.  Follow your caregiver's instructions to manage and treat other pain. Document Released: 08/29/2011 Document Revised: 01/04/2013 Document Reviewed: 08/29/2011 Exit[employer] Patient Information 2015 [employer]. This information is not intended to replace advice given to you by your health care provider. Make sure you discuss any questions you have with your health care provider.  

## 2015-03-26 ENCOUNTER — Other Ambulatory Visit: Payer: Self-pay | Admitting: Physical Medicine & Rehabilitation

## 2015-04-24 ENCOUNTER — Other Ambulatory Visit: Payer: Self-pay | Admitting: Physical Medicine & Rehabilitation

## 2015-05-01 ENCOUNTER — Other Ambulatory Visit: Payer: Self-pay | Admitting: *Deleted

## 2015-05-01 ENCOUNTER — Other Ambulatory Visit: Payer: Self-pay | Admitting: Physical Medicine & Rehabilitation

## 2015-05-01 MED ORDER — TRAMADOL HCL 50 MG PO TABS: ORAL_TABLET | ORAL | Status: DC

## 2015-05-01 NOTE — Telephone Encounter (Signed)
Recd electronic [facility]ill [facility] from [facility] for Tramadol 50 mg #120  #RF2

## 2015-05-18 ENCOUNTER — Encounter: Payer: Self-pay | Admitting: Physical Medicine & Rehabilitation

## 2015-05-18 ENCOUNTER — Ambulatory Visit (HOSPITAL_BASED_OUTPATIENT_CLINIC_OR_DEPARTMENT_OTHER): Payer: Medicaid Other | Admitting: Physical Medicine & Rehabilitation

## 2015-05-18 ENCOUNTER — Encounter: Payer: Medicaid Other | Attending: Physical Medicine & Rehabilitation

## 2015-05-18 VITALS — BP 114/69 | HR 83

## 2015-05-18 DIAGNOSIS — M791 Myalgia: Secondary | ICD-10-CM | POA: Diagnosis not present

## 2015-05-18 DIAGNOSIS — M609 Myositis, unspecified: Secondary | ICD-10-CM

## 2015-05-18 DIAGNOSIS — IMO0001 Reserved for inherently not codable concepts without codable children: Secondary | ICD-10-CM

## 2015-05-18 NOTE — Progress Notes (Signed)
   Subjective:    Patient ID: Sonia Bailey, female    DOB: 02-18-1986, 29 y.o.   MRN: [id number]  HPI    Review of Systems     Objective:   Physical Exam  Tenderness palpation bilateral L3 L4 L5 paraspinals more so on the right than on the left. The L5 paraspinals not very tender on the left      Assessment & Plan:  Trigger Point Injection  Indication: Lumbar paraspinal Myofascial pain not relieved by medication management and other conservative care.  Informed consent was obtained after describing risk and benefits of the procedure with the patient, this includes bleeding, bruising, infection and medication side effects.  The patient wishes to proceed and has given written consent.  The patient was placed in a prone position.  The lumbar paraspinal bilateral L3,4,and R L5 area was marked and prepped with Betadine.  It was entered with a 25-gauge 1-1/2 inch needle and 1 mL of 1% lidocaine was injected into each of 8 trigger points, after negative draw back for blood.  The patient tolerated the procedure well.  Post procedure instructions were given.

## 2015-05-18 NOTE — Patient Instructions (Signed)
Trigger Point Injection Trigger points are areas where you have muscle pain. A trigger point injection is a shot given in the trigger point to relieve that pain. A trigger point might feel like a knot in your muscle. It hurts to press on a trigger point. Sometimes the pain spreads out (radiates) to other parts of the body. For example, pressing on a trigger point in your shoulder might cause pain in your arm or neck. You might have one trigger point. Or, you might have more than one. People often have trigger points in their upper back and lower back. They also occur often in the neck and shoulders. Pain from a trigger point lasts for a long time. It can make it hard to keep moving. You might not be able to do the exercise or physical therapy that could help you deal with the pain. A trigger point injection may help. It does not work for everyone. But, it may relieve your pain for a few days or a few months. A trigger point injection does not cure long-lasting (chronic) pain. LET YOUR CAREGIVER KNOW ABOUT:  Any allergies (especially to latex, lidocaine, or steroids).  Blood-thinning medicines that you take. These drugs can lead to bleeding or bruising after an injection. They include:  Aspirin.  Ibuprofen.  Clopidogrel.  Warfarin.  Other medicines you take. This includes all vitamins, herbs, eyedrops, over-the-counter medicines, and creams.  Use of steroids.  Recent infections.  Past problems with numbing medicines.  Bleeding problems.  Surgeries you have had.  Other health problems. RISKS AND COMPLICATIONS A trigger point injection is a safe treatment. However, problems may develop, such as:  Minor side effects usually go away in 1 to 2 days. These may include:  Soreness.  Bruising.  Stiffness.  More serious problems are rare. But, they may include:  Bleeding under the skin (hematoma).  Skin infection.  Breaking off of the needle under your skin.  Lung  puncture.  The trigger point injection may not work for you. BEFORE THE PROCEDURE You may need to stop taking any medicine that thins your blood. This is to prevent bleeding and bruising. Usually these medicines are stopped several days before the injection. No other preparation is needed. PROCEDURE  A trigger point injection can be given in your caregiver's office or in a clinic. Each injection takes 2 minutes or less.  Your caregiver will feel for trigger points. The caregiver may use a marker to circle the area for the injection.  The skin over the trigger point will be washed with a germ-killing (antiseptic) solution.  The caregiver pinches the spot for the injection.  Then, a very thin needle is used for the shot. You may feel pain or a twitching feeling when the needle enters the trigger point.  A numbing solution may be injected into the trigger point. Sometimes a drug to keep down swelling, redness, and warmth (inflammation) is also injected.  Your caregiver moves the needle around the trigger zone until the tightness and twitching goes away.  After the injection, your caregiver may put gentle pressure over the injection site.  Then it is covered with a bandage. AFTER THE PROCEDURE  You can go right home after the injection.  The bandage can be taken off after a few hours.  You may feel sore and stiff for 1 to 2 days.  Go back to your regular activities slowly. Your caregiver may ask you to stretch your muscles. Do not do anything that takes   extra energy for a few days.  Follow your caregiver's instructions to manage and treat other pain. Document Released: 08/29/2011 Document Revised: 01/04/2013 Document Reviewed: 08/29/2011 Exit[employer] Patient Information 2015 [employer]. This information is not intended to replace advice given to you by your health care provider. Make sure you discuss any questions you have with your health care provider.  

## 2015-05-30 ENCOUNTER — Telehealth: Payer: Self-pay | Admitting: *Deleted

## 2015-05-30 DIAGNOSIS — G8929 Other chronic pain: Secondary | ICD-10-CM

## 2015-05-30 DIAGNOSIS — M545 Low back pain: Principal | ICD-10-CM

## 2015-05-30 NOTE — Telephone Encounter (Signed)
Sonia Bailey called and wants to move forward with scheduling xrays on back, it is still hurting.

## 2015-06-06 ENCOUNTER — Telehealth: Payer: Self-pay | Admitting: *Deleted

## 2015-06-06 NOTE — Telephone Encounter (Signed)
Pt says her back is really hurting, is wondering if she could be prescribed something stronger than tramadol to help ease her pain

## 2015-06-06 NOTE — Telephone Encounter (Signed)
May call in Robaxin 1000 mg 3 times a day when necessary #45, no refills  In place of cyclobenzaprine

## 2015-06-07 ENCOUNTER — Other Ambulatory Visit: Payer: Self-pay | Admitting: Physical Medicine & Rehabilitation

## 2015-06-07 MED ORDER — METHOCARBAMOL 500 MG PO TABS: 1000.0000 mg | ORAL_TABLET | Freq: Three times a day (TID) | ORAL | Status: DC | PRN

## 2015-06-07 NOTE — Telephone Encounter (Signed)
Left detailed message on voicemail (per DPR) 

## 2015-06-25 ENCOUNTER — Other Ambulatory Visit: Payer: Self-pay | Admitting: Physical Medicine & Rehabilitation

## 2015-06-26 NOTE — Telephone Encounter (Signed)
You ordered robaxin last, do you want her to have both flexeril and robaxin?

## 2015-06-27 NOTE — Telephone Encounter (Signed)
No it is either robaxin or flexeril

## 2015-07-03 ENCOUNTER — Other Ambulatory Visit: Payer: Self-pay | Admitting: Physical Medicine & Rehabilitation

## 2015-07-03 NOTE — Telephone Encounter (Signed)
We rec'd an electronic request for Robaxin for patient. In previous conversation you okayed a script for this medication with 0 refills.  Pt does have an upcoming appt 08/10/2015

## 2015-07-06 NOTE — Telephone Encounter (Signed)
[facility] [place] [facility]--sent 2 requests over but no response.  Please call at [phone]

## 2015-07-06 NOTE — Telephone Encounter (Signed)
rec'd electronic request and a phone call from the pharmacy regarding a refill for robaxin for pt. Last request was filled in September #45 with no refills. How would you like to proceed

## 2015-07-14 ENCOUNTER — Other Ambulatory Visit: Payer: Self-pay | Admitting: Physical Medicine & Rehabilitation

## 2015-07-17 ENCOUNTER — Other Ambulatory Visit: Payer: Self-pay | Admitting: Physical Medicine & Rehabilitation

## 2015-07-21 ENCOUNTER — Other Ambulatory Visit: Payer: Self-pay | Admitting: Physical Medicine & Rehabilitation

## 2015-07-25 NOTE — Telephone Encounter (Signed)
We rec'd another electronic request for Robaxin, last fill 07/06/2015, #45, no refills....would you like to refill again, patient has appt 08/10/2015

## 2015-08-07 ENCOUNTER — Other Ambulatory Visit: Payer: Self-pay | Admitting: Physical Medicine & Rehabilitation

## 2015-08-10 ENCOUNTER — Encounter: Payer: Medicaid Other | Attending: Physical Medicine & Rehabilitation

## 2015-08-10 ENCOUNTER — Encounter: Payer: Self-pay | Admitting: Physical Medicine & Rehabilitation

## 2015-08-10 ENCOUNTER — Ambulatory Visit (HOSPITAL_BASED_OUTPATIENT_CLINIC_OR_DEPARTMENT_OTHER): Payer: Medicaid Other | Admitting: Physical Medicine & Rehabilitation

## 2015-08-10 VITALS — BP 137/91 | HR 102 | Resp 14

## 2015-08-10 DIAGNOSIS — IMO0001 Reserved for inherently not codable concepts without codable children: Secondary | ICD-10-CM

## 2015-08-10 DIAGNOSIS — M791 Myalgia: Secondary | ICD-10-CM | POA: Diagnosis not present

## 2015-08-10 DIAGNOSIS — M609 Myositis, unspecified: Secondary | ICD-10-CM | POA: Insufficient documentation

## 2015-08-10 MED ORDER — METHOCARBAMOL 500 MG PO TABS: 500.0000 mg | ORAL_TABLET | Freq: Three times a day (TID) | ORAL | Status: DC | PRN

## 2015-08-10 NOTE — Progress Notes (Deleted)
   Subjective:    Patient ID: Sonia Bailey, female    DOB: 02/01/1986, [age] y.o.   MRN: [id number]  HPI   .cpr Review of Systems     Objective:   Physical Exam        Assessment & Plan:

## 2015-08-10 NOTE — Progress Notes (Signed)
Trigger Point Injection  Indication: Lumbar paraspinal Myofascial pain not relieved by medication management and other conservative care.  Informed consent was obtained after describing risk and benefits of the procedure with the patient, this includes bleeding, bruising, infection and medication side effects.  The patient wishes to proceed and has given written consent.  The patient was placed in a prone position.  The lumbar paraspinal bilateral L2,  L3,4,and R L5 area was marked and prepped with Betadine.  It was entered with a 25-gauge 1-1/2 inch needle and 1 mL of 1% lidocaine was injected into each of 8 trigger points, after negative draw back for blood.  The patient tolerated the procedure well.  Post procedure instructions were given.

## 2015-08-10 NOTE — Patient Instructions (Signed)

## 2015-08-31 ENCOUNTER — Other Ambulatory Visit: Payer: Self-pay | Admitting: Physical Medicine & Rehabilitation

## 2015-09-01 ENCOUNTER — Other Ambulatory Visit: Payer: Self-pay | Admitting: Physical Medicine & Rehabilitation

## 2015-10-05 ENCOUNTER — Other Ambulatory Visit: Payer: Self-pay | Admitting: Physical Medicine & Rehabilitation

## 2015-10-09 ENCOUNTER — Other Ambulatory Visit: Payer: Self-pay | Admitting: Physical Medicine & Rehabilitation

## 2015-10-10 ENCOUNTER — Telehealth: Payer: Self-pay | Admitting: Physical Medicine & Rehabilitation

## 2015-10-10 NOTE — Telephone Encounter (Signed)
Patient needs a refill on Tramadol, please call patient when this is done [phone].

## 2015-10-11 ENCOUNTER — Other Ambulatory Visit: Payer: Self-pay | Admitting: Physical Medicine & Rehabilitation

## 2015-10-11 NOTE — Telephone Encounter (Signed)
Pt has refills on her Tramadol already. Advised pt to call pharmacy to get refills.

## 2015-11-02 ENCOUNTER — Other Ambulatory Visit: Payer: Self-pay | Admitting: Nurse Practitioner

## 2015-11-10 ENCOUNTER — Encounter: Payer: Medicaid Other | Attending: Physical Medicine & Rehabilitation

## 2015-11-10 ENCOUNTER — Ambulatory Visit (HOSPITAL_BASED_OUTPATIENT_CLINIC_OR_DEPARTMENT_OTHER): Payer: Medicaid Other | Admitting: Physical Medicine & Rehabilitation

## 2015-11-10 ENCOUNTER — Encounter: Payer: Self-pay | Admitting: Physical Medicine & Rehabilitation

## 2015-11-10 VITALS — BP 133/71 | HR 106 | Resp 14

## 2015-11-10 DIAGNOSIS — M609 Myositis, unspecified: Secondary | ICD-10-CM | POA: Insufficient documentation

## 2015-11-10 DIAGNOSIS — M791 Myalgia: Secondary | ICD-10-CM | POA: Diagnosis not present

## 2015-11-10 DIAGNOSIS — G8929 Other chronic pain: Secondary | ICD-10-CM | POA: Diagnosis not present

## 2015-11-10 DIAGNOSIS — M545 Low back pain: Secondary | ICD-10-CM
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
## 2015-11-10 NOTE — Patient Instructions (Signed)
I would recommend resuming chiropractic care  We'll see you in 3 months, if you need medication refill, please call the pharmacy and they will send [place] a request

## 2015-11-18 ENCOUNTER — Other Ambulatory Visit: Payer: Self-pay | Admitting: Physical Medicine & Rehabilitation

## 2015-11-20 ENCOUNTER — Other Ambulatory Visit: Payer: Self-pay | Admitting: Physical Medicine & Rehabilitation

## 2015-11-20 NOTE — Telephone Encounter (Signed)
You noted at last visit about Ms Lusty taking excess of 8 pills per day of tramadol and said max she could take is 8.  Note from pharmacy "RX CALLED IN ON 10/11/15 WITH 2 REFILLS----15 DAY SUPPLY-HAS ALREADY USED BOTH REFILLS-NO OTHER RX ON FILE."   Do you want to refill with increased dispense since you said that she could take max of 8 per day but you are only dispensing enough for 4/day at #120, which pharmacy is calling a 15 day supply.  Please advise

## 2015-11-20 NOTE — Telephone Encounter (Signed)
May call in 1 month supply of tramadol, 50 mg, 2 tablets 4 times a day #240 No refill

## 2015-11-21 ENCOUNTER — Other Ambulatory Visit: Payer: Self-pay | Admitting: Physical Medicine & Rehabilitation

## 2015-11-21 NOTE — Telephone Encounter (Signed)
Called to pharmacy. Notified Khristina of changed directions and NOT to take more than 8 day and this is one month supply.

## 2015-11-21 NOTE — Telephone Encounter (Signed)
May change methocarbamol 500 mg #90 one by mouth every 8 hours when necessary 1 refill

## 2015-11-21 NOTE — Telephone Encounter (Signed)
We have the same issue with Sonia Bailey's methocarbamol.  Her directions say q 8 hr but she gets #45 and pharmacy is asking for refills.

## 2015-12-15 ENCOUNTER — Other Ambulatory Visit: Payer: Self-pay | Admitting: Physical Medicine & Rehabilitation

## 2015-12-18 ENCOUNTER — Other Ambulatory Visit: Payer: Self-pay | Admitting: Physical Medicine & Rehabilitation

## 2016-01-17 ENCOUNTER — Other Ambulatory Visit: Payer: Self-pay | Admitting: Physical Medicine & Rehabilitation

## 2016-02-06 ENCOUNTER — Ambulatory Visit (HOSPITAL_BASED_OUTPATIENT_CLINIC_OR_DEPARTMENT_OTHER): Payer: Medicaid Other | Admitting: Physical Medicine & Rehabilitation

## 2016-02-06 ENCOUNTER — Encounter: Payer: Self-pay | Admitting: Physical Medicine & Rehabilitation

## 2016-02-06 ENCOUNTER — Encounter: Payer: Medicaid Other | Attending: Physical Medicine & Rehabilitation

## 2016-02-06 VITALS — BP 118/78 | HR 84 | Resp 14

## 2016-02-06 DIAGNOSIS — M533 Sacrococcygeal disorders, not elsewhere classified: Secondary | ICD-10-CM | POA: Diagnosis not present

## 2016-02-06 DIAGNOSIS — M545 Low back pain, unspecified: Secondary | ICD-10-CM

## 2016-02-06 DIAGNOSIS — M791 Myalgia: Secondary | ICD-10-CM | POA: Insufficient documentation

## 2016-02-06 DIAGNOSIS — M7918 Myalgia, other site: Secondary | ICD-10-CM | POA: Insufficient documentation

## 2016-02-06 DIAGNOSIS — G8929 Other chronic pain: Secondary | ICD-10-CM | POA: Diagnosis not present

## 2016-02-06 DIAGNOSIS — M609 Myositis, unspecified: Secondary | ICD-10-CM | POA: Insufficient documentation

## 2016-02-06 MED ORDER — TRAMADOL HCL 50 MG PO TABS: 100.0000 mg | ORAL_TABLET | Freq: Four times a day (QID) | ORAL | Status: DC | PRN

## 2016-02-06 NOTE — Patient Instructions (Signed)
Continue with your course strengthening exercises. If you have flareup of pain for your next appointment please call to schedule trigger point injections
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
## 2016-03-14 ENCOUNTER — Other Ambulatory Visit: Payer: Self-pay | Admitting: Physical Medicine & Rehabilitation

## 2016-03-15 NOTE — Telephone Encounter (Signed)
Is refill okay? No recent documentation.

## 2016-05-10 ENCOUNTER — Other Ambulatory Visit: Payer: Self-pay | Admitting: *Deleted

## 2016-05-10 MED ORDER — METHOCARBAMOL 500 MG PO TABS: ORAL_TABLET | ORAL | 1 refills | Status: AC

## 2016-08-05 ENCOUNTER — Other Ambulatory Visit: Payer: Self-pay | Admitting: *Deleted

## 2016-08-05 MED ORDER — TRAMADOL HCL 50 MG PO TABS: 100.0000 mg | ORAL_TABLET | Freq: Four times a day (QID) | ORAL | 0 refills | Status: DC | PRN

## 2016-08-08 ENCOUNTER — Encounter: Payer: Self-pay | Admitting: Physical Medicine & Rehabilitation

## 2016-08-08 ENCOUNTER — Ambulatory Visit (HOSPITAL_BASED_OUTPATIENT_CLINIC_OR_DEPARTMENT_OTHER): Payer: Medicaid Other | Admitting: Physical Medicine & Rehabilitation

## 2016-08-08 ENCOUNTER — Encounter: Payer: Medicaid Other | Attending: Physical Medicine & Rehabilitation

## 2016-08-08 VITALS — BP 111/78 | HR 93 | Resp 14

## 2016-08-08 DIAGNOSIS — G8929 Other chronic pain: Secondary | ICD-10-CM | POA: Diagnosis not present

## 2016-08-08 DIAGNOSIS — M7918 Myalgia, other site: Secondary | ICD-10-CM

## 2016-08-08 DIAGNOSIS — M545 Low back pain: Secondary | ICD-10-CM

## 2016-08-08 DIAGNOSIS — M791 Myalgia: Secondary | ICD-10-CM | POA: Insufficient documentation

## 2016-08-08 MED ORDER — TRAMADOL HCL 50 MG PO TABS: 100.0000 mg | ORAL_TABLET | Freq: Four times a day (QID) | ORAL | 5 refills | Status: DC | PRN
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
## 2016-08-08 NOTE — Patient Instructions (Signed)
Trigger Point Injection Trigger points are areas where you have pain. A trigger point injection is a shot given in the trigger point to help relieve pain for a few days to a few months. Common places for trigger points include:  The neck.  The shoulders.  The upper back.  The lower back. A trigger point injection will not cure long-lasting (chronic) pain permanently. These injections do not always work for every person, but for some people they can help to relieve pain for a few days to a few months. Tell a health care provider about:  Any allergies you have.  All medicines you are taking, including vitamins, herbs, eye drops, creams, and over-the-counter medicines.  Any problems you or family members have had with anesthetic medicines.  Any blood disorders you have.  Any surgeries you have had.  Any medical conditions you have. What are the risks? Generally, this is a safe procedure. However, problems may occur, including:  Infection.  Bleeding.  Allergic reaction to the injected medicine.  Irritation of the skin around the injection site. What happens before the procedure?  Ask your health care provider about changing or stopping your regular medicines. This is especially important if you are taking diabetes medicines or blood thinners. What happens during the procedure?  Your health care provider will feel for trigger points. A marker may be used to circle the area for the injection.  The skin over the trigger point will be washed with a germ-killing (antiseptic) solution.  A thin needle is used for the shot. You may feel pain or a twitching feeling when the needle enters the trigger point.  A numbing solution may be injected into the trigger point. Sometimes a medicine to keep down swelling, redness, and warmth (inflammation) is also injected.  Your health care provider may move the needle around the area where the trigger point is located until the tightness and  twitching goes away.  After the injection, your health care provider may put gentle pressure over the injection site.  The injection site will be covered with a bandage (dressing). The procedure may vary among health care providers and hospitals. What happens after the procedure?  The dressing can be taken off in a few hours or as told by your health care provider.  You may feel sore and stiff for 1-2 days. This information is not intended to replace advice given to you by your health care provider. Make sure you discuss any questions you have with your health care provider. Document Released: 08/29/2011 Document Revised: 05/12/2016 Document Reviewed: 02/27/2015 Elsevier Interactive Patient Education  2017 [employer]  

## 2016-10-23 ENCOUNTER — Encounter (INDEPENDENT_AMBULATORY_CARE_PROVIDER_SITE_OTHER): Payer: Self-pay | Admitting: *Deleted

## 2017-02-04 ENCOUNTER — Ambulatory Visit (HOSPITAL_BASED_OUTPATIENT_CLINIC_OR_DEPARTMENT_OTHER): Payer: Medicaid Other | Admitting: Physical Medicine & Rehabilitation

## 2017-02-04 ENCOUNTER — Encounter: Payer: Medicaid Other | Attending: Physical Medicine & Rehabilitation

## 2017-02-04 ENCOUNTER — Encounter: Payer: Self-pay | Admitting: Physical Medicine & Rehabilitation

## 2017-02-04 VITALS — BP 110/76 | HR 110 | Resp 16

## 2017-02-04 DIAGNOSIS — M791 Myalgia: Secondary | ICD-10-CM | POA: Insufficient documentation

## 2017-02-04 DIAGNOSIS — M7918 Myalgia, other site: Secondary | ICD-10-CM

## 2017-02-04 DIAGNOSIS — M7061 Trochanteric bursitis, right hip: Secondary | ICD-10-CM | POA: Diagnosis present

## 2017-02-04 DIAGNOSIS — M7062 Trochanteric bursitis, left hip: Secondary | ICD-10-CM | POA: Diagnosis not present

## 2017-02-04 MED ORDER — TRAMADOL HCL 50 MG PO TABS: 100.0000 mg | ORAL_TABLET | Freq: Four times a day (QID) | ORAL | 5 refills | Status: AC | PRN

## 2017-02-04 MED ORDER — MELOXICAM 7.5 MG PO TABS: 7.5000 mg | ORAL_TABLET | Freq: Every day | ORAL | 0 refills | Status: AC

## 2017-02-04 NOTE — Patient Instructions (Addendum)
Trigger Point Injection Trigger points are areas where you have pain. A trigger point injection is a shot given in the trigger point to help relieve pain for a few days to a few months. Common places for trigger points include:  The neck.  The shoulders.  The upper back.  The lower back. A trigger point injection will not cure long-lasting (chronic) pain permanently. These injections do not always work for every person, but for some people they can help to relieve pain for a few days to a few months. Tell a health care provider about:  Any allergies you have.  All medicines you are taking, including vitamins, herbs, eye drops, creams, and over-the-counter medicines.  Any problems you or family members have had with anesthetic medicines.  Any blood disorders you have.  Any surgeries you have had.  Any medical conditions you have. What are the risks? Generally, this is a safe procedure. However, problems may occur, including:  Infection.  Bleeding.  Allergic reaction to the injected medicine.  Irritation of the skin around the injection site. What happens before the procedure?  Ask your health care provider about changing or stopping your regular medicines. This is especially important if you are taking diabetes medicines or blood thinners. What happens during the procedure?  Your health care provider will feel for trigger points. A marker may be used to circle the area for the injection.  The skin over the trigger point will be washed with a germ-killing (antiseptic) solution.  A thin needle is used for the shot. You may feel pain or a twitching feeling when the needle enters the trigger point.  A numbing solution may be injected into the trigger point. Sometimes a medicine to keep down swelling, redness, and warmth (inflammation) is also injected.  Your health care provider may move the needle around the area where the trigger point is located until the tightness and  twitching goes away.  After the injection, your health care provider may put gentle pressure over the injection site.  The injection site will be covered with a bandage (dressing). The procedure may vary among health care providers and hospitals. What happens after the procedure?  The dressing can be taken off in a few hours or as told by your health care provider.  You may feel sore and stiff for 1-2 days. This information is not intended to replace advice given to you by your health care provider. Make sure you discuss any questions you have with your health care provider. Document Released: 08/29/2011 Document Revised: 05/12/2016 Document Reviewed: 02/27/2015 Elsevier Interactive Patient Education  2017 [employer] Inc[employer]   Non medication pain relief  Try ice 20min alternating with heating pad for 20 minutes every 2 hours as needed  Try TENs unit that can be puchased in a pharmacy for about 40.00, brands include Aleve or [employer] Hot  Try various muscle cremes  Aspercreme or others that contain trolamine- this is anti inflammatory  Capsaicin creme which reduces Pain substance P  Lidocaine containing creme which has a numbing medicine  Methol and camphor containing creme which has a cooling effect
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
## 2017-03-10 ENCOUNTER — Ambulatory Visit: Payer: Medicaid Other | Admitting: Physical Medicine & Rehabilitation

## 2017-03-10 ENCOUNTER — Encounter: Payer: Medicaid Other | Attending: Physical Medicine & Rehabilitation

## 2017-03-10 DIAGNOSIS — M7061 Trochanteric bursitis, right hip: Secondary | ICD-10-CM | POA: Insufficient documentation

## 2017-03-10 DIAGNOSIS — M791 Myalgia: Secondary | ICD-10-CM | POA: Insufficient documentation

## 2017-03-10 DIAGNOSIS — M7062 Trochanteric bursitis, left hip: Secondary | ICD-10-CM | POA: Insufficient documentation

## 2017-07-31 ENCOUNTER — Other Ambulatory Visit: Payer: Self-pay | Admitting: Physical Medicine & Rehabilitation

## 2017-07-31 NOTE — Telephone Encounter (Signed)
Recieved electronic medication refill request for meloxicam 7.5mg , last time patient was prescribed this medication was 02-04-2017 and was only given 2 week supply.  Last time patient was also seen in this clinic was same date and no-showed last appointment with no appointments made since  In addition, last note stated:  Greater trochanteric bursitis (right, have given her exercises to do at home. In addition, we prescribed mobility 7.5 milligrams daily for 2 weeks, returning clinic in 1 month for left greater trochanter injection, if conservative care is not helpful.       (assuming mobility is Mobic)  Should I refill this medication, please advise

## 2021-08-28 ENCOUNTER — Emergency Department (HOSPITAL_COMMUNITY)
Admission: EM | Admit: 2021-08-28 | Discharge: 2021-08-28 | Disposition: A | Discharge status: Home / Self Care | Payer: Medicaid Other | Attending: Emergency Medicine | Admitting: Emergency Medicine

## 2021-08-28 ENCOUNTER — Encounter (HOSPITAL_COMMUNITY): Payer: Self-pay

## 2021-08-28 ENCOUNTER — Other Ambulatory Visit: Payer: Self-pay

## 2021-08-28 DIAGNOSIS — B029 Zoster without complications: Secondary | ICD-10-CM | POA: Diagnosis present

## 2021-08-28 DIAGNOSIS — Z5321 Procedure and treatment not carried out due to patient leaving prior to being seen by health care provider: Secondary | ICD-10-CM | POA: Insufficient documentation

## 2021-08-28 NOTE — ED Triage Notes (Signed)
Shingles possibly to right arm. Has painful blisters. Has been on steroids and abx for a tooth infection.

## 2021-08-28 NOTE — ED Notes (Signed)
Was told by registration that pt came in to say she was leaving.
